# Patient Record
Sex: Male | Born: 1935 | Race: White | Hispanic: No | Marital: Married | Smoking: Never smoker
Health system: Southern US, Community
[De-identification: ages and names within clinical notes are randomized; demographics above are authoritative.]

## PROBLEM LIST (undated history)

## (undated) DIAGNOSIS — M549 Dorsalgia, unspecified: Secondary | ICD-10-CM

## (undated) DIAGNOSIS — G8929 Other chronic pain: Secondary | ICD-10-CM

## (undated) DIAGNOSIS — M109 Gout, unspecified: Secondary | ICD-10-CM

## (undated) DIAGNOSIS — C61 Malignant neoplasm of prostate: Secondary | ICD-10-CM

## (undated) HISTORY — PX: PROSTATECTOMY: SHX69

---

## 1999-09-17 ENCOUNTER — Ambulatory Visit (HOSPITAL_COMMUNITY): Admission: RE | Admit: 1999-09-17 | Discharge: 1999-09-18 | Payer: Self-pay | Admitting: Ophthalmology

## 1999-09-17 ENCOUNTER — Encounter: Payer: Self-pay | Admitting: Ophthalmology

## 2015-03-26 ENCOUNTER — Emergency Department (HOSPITAL_BASED_OUTPATIENT_CLINIC_OR_DEPARTMENT_OTHER)
Admission: EM | Admit: 2015-03-26 | Discharge: 2015-03-26 | Disposition: A | Discharge status: Home / Self Care | Payer: Medicare Other | Attending: Emergency Medicine | Admitting: Emergency Medicine

## 2015-03-26 ENCOUNTER — Emergency Department (HOSPITAL_BASED_OUTPATIENT_CLINIC_OR_DEPARTMENT_OTHER): Payer: Medicare Other

## 2015-03-26 ENCOUNTER — Encounter (HOSPITAL_BASED_OUTPATIENT_CLINIC_OR_DEPARTMENT_OTHER): Payer: Self-pay | Admitting: *Deleted

## 2015-03-26 DIAGNOSIS — M1990 Unspecified osteoarthritis, unspecified site: Secondary | ICD-10-CM | POA: Insufficient documentation

## 2015-03-26 DIAGNOSIS — M25532 Pain in left wrist: Secondary | ICD-10-CM | POA: Diagnosis present

## 2015-03-26 DIAGNOSIS — Z8546 Personal history of malignant neoplasm of prostate: Secondary | ICD-10-CM | POA: Insufficient documentation

## 2015-03-26 DIAGNOSIS — G8929 Other chronic pain: Secondary | ICD-10-CM | POA: Diagnosis not present

## 2015-03-26 HISTORY — DX: Other chronic pain: G89.29

## 2015-03-26 HISTORY — DX: Dorsalgia, unspecified: M54.9

## 2015-03-26 HISTORY — DX: Malignant neoplasm of prostate: C61

## 2015-03-26 MED ORDER — IBUPROFEN 600 MG PO TABS: 600.0000 mg | ORAL_TABLET | Freq: Three times a day (TID) | ORAL | Status: AC | PRN

## 2015-03-26 MED ORDER — OXYCODONE-ACETAMINOPHEN 5-325 MG PO TABS
1.0000 | ORAL_TABLET | Freq: Once | ORAL | Status: AC
  Administered 2015-03-26: 1 via ORAL
  Filled 2015-03-26: qty 1

## 2015-03-26 MED ORDER — IBUPROFEN 200 MG PO TABS
600.0000 mg | ORAL_TABLET | Freq: Once | ORAL | Status: AC
  Administered 2015-03-26: 600 mg via ORAL
  Filled 2015-03-26: qty 1

## 2015-03-26 MED ORDER — DOXYCYCLINE HYCLATE 100 MG PO TABS
100.0000 mg | ORAL_TABLET | Freq: Once | ORAL | Status: AC
  Administered 2015-03-26: 100 mg via ORAL
  Filled 2015-03-26: qty 1

## 2015-03-26 MED ORDER — DOXYCYCLINE HYCLATE 100 MG PO CAPS: 100.0000 mg | ORAL_CAPSULE | Freq: Two times a day (BID) | ORAL | Status: DC

## 2015-03-26 MED ORDER — OXYCODONE-ACETAMINOPHEN 5-325 MG PO TABS: 1.0000 | ORAL_TABLET | ORAL | Status: DC | PRN

## 2015-03-26 MED FILL — OXYCODONE/APAP 5-325: 5-325 | 2 days supply | Qty: 15 | Fill #0

## 2015-03-26 MED FILL — IBUPROFEN 600 MG TABLET: 600 | 5 days supply | Qty: 15 | Fill #0

## 2015-03-26 MED FILL — DOXYCYCLINE HYC 100 MG CAP: 100 | 7 days supply | Qty: 14 | Fill #0

## 2015-03-26 NOTE — Discharge Instructions (Signed)

## 2015-03-26 NOTE — ED Notes (Signed)
Pt sts he was trying to open a pill box 3 weeks ago and began having pain in his left wrist radiating up his left arm. Pt has an appt with Dr. Aretta Nip for mid-March.

## 2015-03-26 NOTE — ED Provider Notes (Signed)
CSN: [id number]     Arrival date & time 03/26/15  0935 History   First MD Initiated Contact with Patient 03/26/15 1019     Chief Complaint  Patient presents with  . Wrist Pain      HPI Patient presents to the emergency department with worsening left thumb pain over the past week.  He is given a wrist splint and has tramadol but this has not improved his symptoms.  He denies fevers and chills.  He reports a history of gout.  He denies injury or trauma to his thumb.  He reports some pain with flexion of the IP joint of the left thumb as well as the pain which initiated at the left MCP joint of the thumb.  No significant spreading redness.  He does feel like he has warmth and redness to his left hand.   Past Medical History  Diagnosis Date  . Chronic back pain   . Prostate cancer [facility])    Past Surgical History  Procedure Laterality Date  . Prostatectomy     No family history on file. Social History  Substance Use Topics  . Smoking status: Never Smoker   . Smokeless tobacco: None  . Alcohol Use: No    Review of Systems  All other systems reviewed and are negative.     Allergies  Review of patient's allergies indicates no known allergies.  Home Medications   Prior to Admission medications   Medication Sig Start Date End Date Taking? Authorizing Provider  doxycycline (VIBRAMYCIN) 100 MG capsule Take 1 capsule (100 mg total) by mouth 2 (two) times daily. 03/26/15   Jola Schmidt, MD  ibuprofen (ADVIL,MOTRIN) 600 MG tablet Take 1 tablet (600 mg total) by mouth every 8 (eight) hours as needed. 03/26/15   Jola Schmidt, MD  oxyCODONE-acetaminophen (PERCOCET/ROXICET) 5-325 MG tablet Take 1 tablet by mouth every 4 (four) hours as needed for severe pain. 03/26/15   Jola Schmidt, MD   BP 106/92 mmHg  Pulse 49  Temp(Src) 99.1 F (37.3 C) (Oral)  Resp 20  Ht 5\' 6"  (1.676 m)  Wt 130 lb (58.968 kg)  BMI 20.99 kg/m2  SpO2 98% Physical Exam  Constitutional: He is oriented to person,  place, and time. He appears well-developed and well-nourished.  HENT:  Head: Normocephalic.  Eyes: EOM are normal.  Neck: Normal range of motion.  Pulmonary/Chest: Effort normal.  Abdominal: He exhibits no distension.  Musculoskeletal: Normal range of motion.  Warmth of the left thumb and pain with range of motion of the left thumb IP joint as well as left thumb MCP joint.  Normal flexion and extension at the left wrist.  Normal left radial pulse.  Normal perfusion of the left thumb.  No extension of erythema or warmth past his wrist.  He does have mild warmth and erythema of his left thumb and dorsum of his left hand.  No obvious drainage or fluctuance.  Neurological: He is alert and oriented to person, place, and time.  Psychiatric: He has a normal mood and affect.  Nursing note and vitals reviewed.   ED Course  Procedures (including critical care time) Labs Review Labs Reviewed - No data to display  Imaging Review Dg Wrist Complete Left  03/26/2015  CLINICAL DATA:  Pt having lateral pain for 3 weeks after opening a pill bottle EXAM: LEFT WRIST - COMPLETE 3+ VIEW COMPARISON:  None. FINDINGS: No distal radius or ulnar fracture. Radiocarpal joint is intact. No carpal fracture. No soft tissue  abnormality. There is calcification in the triangular fiber ligament. IMPRESSION: 1. No evidence acute fracture. 2. 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 Electronically Signed   By: Suzy Bouchard M.D.   On: 03/26/2015 10:39   I have personally reviewed and evaluated these images and lab results as part of my medical decision-making.   EKG Interpretation None      MDM   Final diagnoses:  Inflammatory arthritis ([place])    I suspect this is all inflammatory arthritis/gout.  Patient be started on pain medication and anti-inflammatories.  He will be given a short course of doxycycline as he does have some erythema  and warmth.  There is no obvious fluctuance or anything that needs immediate surgical intervention at this time.  He'll be placed in a thumb spica splint for comfort.  Lastly return to the ER in 72 hours for recheck or sooner for any new or worsening symptoms.  He does have an orthopedist as well.  Vascular follow-up with his orthopedist.  He states he has appointment scheduled for March 10 and thus I have asked that he come back to the ER in 72 hours for recheck    Jola Schmidt, MD 03/26/15 1133

## 2015-03-29 ENCOUNTER — Encounter (HOSPITAL_BASED_OUTPATIENT_CLINIC_OR_DEPARTMENT_OTHER): Payer: Self-pay | Admitting: Emergency Medicine

## 2015-03-29 ENCOUNTER — Emergency Department (HOSPITAL_BASED_OUTPATIENT_CLINIC_OR_DEPARTMENT_OTHER)
Admission: EM | Admit: 2015-03-29 | Discharge: 2015-03-29 | Disposition: A | Discharge status: Home / Self Care | Payer: Medicare Other | Attending: Emergency Medicine | Admitting: Emergency Medicine

## 2015-03-29 DIAGNOSIS — Z8546 Personal history of malignant neoplasm of prostate: Secondary | ICD-10-CM | POA: Insufficient documentation

## 2015-03-29 DIAGNOSIS — G8929 Other chronic pain: Secondary | ICD-10-CM | POA: Insufficient documentation

## 2015-03-29 DIAGNOSIS — Z09 Encounter for follow-up examination after completed treatment for conditions other than malignant neoplasm: Secondary | ICD-10-CM | POA: Insufficient documentation

## 2015-03-29 DIAGNOSIS — Z792 Long term (current) use of antibiotics: Secondary | ICD-10-CM | POA: Insufficient documentation

## 2015-03-29 DIAGNOSIS — M109 Gout, unspecified: Secondary | ICD-10-CM | POA: Diagnosis not present

## 2015-03-29 HISTORY — DX: Gout, unspecified: M10.9

## 2015-03-29 NOTE — Discharge Instructions (Signed)

## 2015-03-29 NOTE — ED Provider Notes (Signed)
CSN: [id number]     Arrival date & time 03/29/15  [id number] History   First MD Initiated Contact with Patient 03/29/15 0900     Chief Complaint  Patient presents with  . Follow-up     (Consider location/radiation/quality/duration/timing/severity/associated sxs/prior Treatment) HPI   Frederick Allen is a 82 y.o. male with PMH significant for chronic back pain, gout, and prostate cancer who presents with follow up from 03/26/15 visit for inflammatory arthritis/gout of his left thumb. At the time, he had decreased ROM, pain with movement, swelling, and erythema.  He states his symptoms have completely resolved.  Patient was discharged home with percocet, ibuprofen, and doxycycline.  He has been compliant with all medications. Denies fever, chills, nausea, vomiting, left thumb pain, or any other symptoms.   Past Medical History  Diagnosis Date  . Chronic back pain   . Prostate cancer ([place])   . Gout attack    Past Surgical History  Procedure Laterality Date  . Prostatectomy     No family history on file. Social History  Substance Use Topics  . Smoking status: Never Smoker   . Smokeless tobacco: None  . Alcohol Use: No    Review of Systems All other systems negative unless otherwise stated in HPI    Allergies  Review of patient's allergies indicates no known allergies.  Home Medications   Prior to Admission medications   Medication Sig Start Date End Date Taking? Authorizing Provider  doxycycline (VIBRAMYCIN) 100 MG capsule Take 1 capsule (100 mg total) by mouth 2 (two) times daily. 03/26/15   Jola Schmidt, MD  ibuprofen (ADVIL,MOTRIN) 600 MG tablet Take 1 tablet (600 mg total) by mouth every 8 (eight) hours as needed. 03/26/15   Jola Schmidt, MD  oxyCODONE-acetaminophen (PERCOCET/ROXICET) 5-325 MG tablet Take 1 tablet by mouth every 4 (four) hours as needed for severe pain. 03/26/15   Jola Schmidt, MD   BP 158/112 mmHg  Pulse 72  Temp(Src) 98.2 F (36.8 C) (Oral)  Resp 20   SpO2 96% Physical Exam  Constitutional: He is oriented to person, place, and time. He appears well-developed and well-nourished.  HENT:  Head: Normocephalic and atraumatic.  Right Ear: External ear normal.  Left Ear: External ear normal.  Eyes: Conjunctivae are normal. No scleral icterus.  Neck: No tracheal deviation present.  Cardiovascular:  Pulses:      Radial pulses are 2+ on the right side, and 2+ on the left side.  Pulmonary/Chest: Effort normal. No respiratory distress.  Abdominal: He exhibits no distension.  Musculoskeletal: Normal range of motion.  No warmth, erythema, induration, fluctuance, or swelling of the left thumb. No pain with full active range of motion of IPJ and MCPJ of left thumb. Full active range of motion of wrist flexion and extension without pain.  Neurological: He is alert and oriented to person, place, and time.  Skin: Skin is warm and dry. No erythema.  Psychiatric: He has a normal mood and affect. His behavior is normal.    ED Course  Procedures (including critical care time) Labs Review Labs Reviewed - No data to display  Imaging Review No results found. I have personally reviewed and evaluated these images and lab results as part of my medical decision-making.   EKG Interpretation None      MDM   Final diagnoses:  Follow up    Patient presents for recheck of his left thumb inflammatory arthritis/gout. She has been compliant with his discharge medications which include doxycycline, Percocet, and  ibuprofen. Denies any systemic symptoms. Patient advised to complete his course of doxycycline. On exam, no signs of infection with full active range of motion of left thumb at the IP joint and MCP joint without pain. NVI. Follow-up PCP. Discussed return precautions. Patient agrees and acknowledges the above plan for discharge.    Gloriann Loan, PA-C 03/29/15 0935  Forde Dandy, MD 03/29/15 [phone]

## 2015-03-29 NOTE — ED Notes (Signed)
Pt here for recheck of his left thumb.  Pt states it is much better.  Swelling has decreased, pain has decreased, movement has improved.

## 2016-10-04 ENCOUNTER — Encounter (HOSPITAL_COMMUNITY): Payer: Self-pay | Admitting: Emergency Medicine

## 2016-10-04 ENCOUNTER — Inpatient Hospital Stay (HOSPITAL_COMMUNITY)
Admission: EM | Admit: 2016-10-04 | Discharge: 2016-10-09 | DRG: 694 | Disposition: A | Discharge status: Skilled Nursing Facility | Payer: Medicare Other | Attending: Urology | Admitting: Urology

## 2016-10-04 ENCOUNTER — Emergency Department (HOSPITAL_COMMUNITY): Payer: Medicare Other

## 2016-10-04 DIAGNOSIS — R05 Cough: Secondary | ICD-10-CM

## 2016-10-04 DIAGNOSIS — N132 Hydronephrosis with renal and ureteral calculous obstruction: Principal | ICD-10-CM | POA: Diagnosis present

## 2016-10-04 DIAGNOSIS — Z9079 Acquired absence of other genital organ(s): Secondary | ICD-10-CM

## 2016-10-04 DIAGNOSIS — F0390 Unspecified dementia without behavioral disturbance: Secondary | ICD-10-CM | POA: Diagnosis present

## 2016-10-04 DIAGNOSIS — G8929 Other chronic pain: Secondary | ICD-10-CM | POA: Diagnosis present

## 2016-10-04 DIAGNOSIS — R059 Cough, unspecified: Secondary | ICD-10-CM

## 2016-10-04 DIAGNOSIS — N200 Calculus of kidney: Secondary | ICD-10-CM | POA: Diagnosis not present

## 2016-10-04 DIAGNOSIS — N201 Calculus of ureter: Secondary | ICD-10-CM | POA: Diagnosis present

## 2016-10-04 DIAGNOSIS — Z888 Allergy status to other drugs, medicaments and biological substances status: Secondary | ICD-10-CM

## 2016-10-04 DIAGNOSIS — Z8546 Personal history of malignant neoplasm of prostate: Secondary | ICD-10-CM

## 2016-10-04 DIAGNOSIS — L8990 Pressure ulcer of unspecified site, unspecified stage: Secondary | ICD-10-CM | POA: Diagnosis present

## 2016-10-04 DIAGNOSIS — R509 Fever, unspecified: Secondary | ICD-10-CM

## 2016-10-04 MED ORDER — ACETAMINOPHEN 500 MG PO TABS: 1000.0000 mg | ORAL_TABLET | Freq: Once | ORAL | Status: DC

## 2016-10-04 MED ORDER — SODIUM CHLORIDE 0.9 % IV BOLUS (SEPSIS)
1000.0000 mL | Freq: Once | INTRAVENOUS | Status: AC
  Administered 2016-10-05: 1000 mL via INTRAVENOUS

## 2016-10-04 NOTE — ED Triage Notes (Signed)
Brought in by EMS from [facility] facility with c/o fever, onset 5 hours ago.  Pt's oral temp by EMS was T101.8---- was given Tylenol 1000 mg po.  Per staff, pt was observed to be weak and lethargic.

## 2016-10-04 NOTE — ED Notes (Signed)
Bed: MI19 Expected date:  Expected time:  Means of arrival:  Comments: 66m fever

## 2016-10-05 ENCOUNTER — Emergency Department (HOSPITAL_COMMUNITY): Payer: Medicare Other

## 2016-10-05 ENCOUNTER — Encounter (HOSPITAL_COMMUNITY)
Admission: EM | Disposition: A | Discharge status: Skilled Nursing Facility | Payer: Self-pay | Source: Home / Self Care | Attending: Urology

## 2016-10-05 ENCOUNTER — Encounter (HOSPITAL_COMMUNITY): Payer: Self-pay | Admitting: *Deleted

## 2016-10-05 ENCOUNTER — Emergency Department (HOSPITAL_COMMUNITY): Payer: Medicare Other | Admitting: Anesthesiology

## 2016-10-05 DIAGNOSIS — L8990 Pressure ulcer of unspecified site, unspecified stage: Secondary | ICD-10-CM | POA: Insufficient documentation

## 2016-10-05 DIAGNOSIS — N201 Calculus of ureter: Secondary | ICD-10-CM | POA: Diagnosis present

## 2016-10-05 HISTORY — PX: CYSTOSCOPY W/ URETERAL STENT PLACEMENT: SHX1429

## 2016-10-05 LAB — URINALYSIS, ROUTINE W REFLEX MICROSCOPIC
Bilirubin Urine: NEGATIVE
Glucose, UA: NEGATIVE mg/dL
KETONES UR: 5 mg/dL — AB
Nitrite: NEGATIVE
PROTEIN: 30 mg/dL — AB
Specific Gravity, Urine: 1.017 (ref 1.005–1.030)
pH: 5 (ref 5.0–8.0)

## 2016-10-05 LAB — COMPREHENSIVE METABOLIC PANEL
ALT: 12 U/L — AB (ref 17–63)
AST: 35 U/L (ref 15–41)
Albumin: 3.7 g/dL (ref 3.5–5.0)
Alkaline Phosphatase: 42 U/L (ref 38–126)
Anion gap: 10 (ref 5–15)
BILIRUBIN TOTAL: 0.7 mg/dL (ref 0.3–1.2)
BUN: 62 mg/dL — AB (ref 6–20)
CALCIUM: 8.9 mg/dL (ref 8.9–10.3)
CO2: 18 mmol/L — ABNORMAL LOW (ref 22–32)
CREATININE: 2.47 mg/dL — AB (ref 0.61–1.24)
Chloride: 111 mmol/L (ref 101–111)
GFR calc Af Amer: 24 mL/min — ABNORMAL LOW (ref >60)
GFR, EST NON AFRICAN AMERICAN: 21 mL/min — AB (ref >60)
GLUCOSE: 180 mg/dL — AB (ref 65–99)
Potassium: 3.7 mmol/L (ref 3.5–5.1)
Sodium: 139 mmol/L (ref 135–145)
Total Protein: 7.1 g/dL (ref 6.5–8.1)

## 2016-10-05 LAB — C DIFFICILE QUICK SCREEN W PCR REFLEX
C Diff antigen: NEGATIVE
C Diff interpretation: NOT DETECTED
C Diff toxin: NEGATIVE

## 2016-10-05 LAB — CBC WITH DIFFERENTIAL/PLATELET
BASOS PCT: 0 %
Basophils Absolute: 0 10*3/uL (ref 0.0–0.1)
Eosinophils Absolute: 0 10*3/uL (ref 0.0–0.7)
Eosinophils Relative: 0 %
HEMATOCRIT: 30 % — AB (ref 39.0–52.0)
Hemoglobin: 10 g/dL — ABNORMAL LOW (ref 13.0–17.0)
Lymphocytes Relative: 14 %
Lymphs Abs: 1.4 10*3/uL (ref 0.7–4.0)
MCH: 29.5 pg (ref 26.0–34.0)
MCHC: 33.3 g/dL (ref 30.0–36.0)
MCV: 88.5 fL (ref 78.0–100.0)
MONO ABS: 1.8 10*3/uL — AB (ref 0.1–1.0)
MONOS PCT: 17 %
NEUTROS ABS: 7.3 10*3/uL (ref 1.7–7.7)
Neutrophils Relative %: 69 %
Platelets: 230 10*3/uL (ref 150–400)
RBC: 3.39 MIL/uL — ABNORMAL LOW (ref 4.22–5.81)
RDW: 15 % (ref 11.5–15.5)
WBC: 10.5 10*3/uL (ref 4.0–10.5)

## 2016-10-05 LAB — MRSA PCR SCREENING: MRSA BY PCR: NEGATIVE

## 2016-10-05 LAB — I-STAT CG4 LACTIC ACID, ED: Lactic Acid, Venous: 1.45 mmol/L (ref 0.5–1.9)

## 2016-10-05 SURGERY — CYSTOSCOPY, WITH RETROGRADE PYELOGRAM AND URETERAL STENT INSERTION
Anesthesia: Monitor Anesthesia Care | Site: Ureter | Laterality: Left

## 2016-10-05 MED ORDER — SODIUM CHLORIDE 0.9 % IV SOLN
Freq: Once | INTRAVENOUS | Status: AC
  Administered 2016-10-05: 05:00:00 via INTRAVENOUS

## 2016-10-05 MED ORDER — PIPERACILLIN-TAZOBACTAM 3.375 G IVPB 30 MIN: 3.3750 g | Freq: Once | INTRAVENOUS | Status: DC

## 2016-10-05 MED ORDER — SODIUM CHLORIDE 0.9 % IV SOLN
INTRAVENOUS | Status: DC | PRN
  Administered 2016-10-05: 09:00:00 via INTRAVENOUS

## 2016-10-05 MED ORDER — IOHEXOL 300 MG/ML  SOLN
INTRAMUSCULAR | Status: DC | PRN
  Administered 2016-10-05: 10 mL

## 2016-10-05 MED ORDER — PIPERACILLIN-TAZOBACTAM IN DEX 2-0.25 GM/50ML IV SOLN
2.2500 g | Freq: Once | INTRAVENOUS | Status: AC
  Administered 2016-10-05: 2.25 g via INTRAVENOUS
  Filled 2016-10-05 (×2): qty 50

## 2016-10-05 MED ORDER — 0.9 % SODIUM CHLORIDE (POUR BTL) OPTIME
TOPICAL | Status: DC | PRN
  Administered 2016-10-05: 1000 mL

## 2016-10-05 MED ORDER — SODIUM CHLORIDE 0.9 % IR SOLN
Status: DC | PRN
  Administered 2016-10-05: 3000 mL

## 2016-10-05 MED ORDER — SENNA 8.6 MG PO TABS: 1.0000 | ORAL_TABLET | Freq: Two times a day (BID) | ORAL | Status: DC

## 2016-10-05 MED ORDER — FENTANYL CITRATE (PF) 100 MCG/2ML IJ SOLN
INTRAMUSCULAR | Status: AC
  Filled 2016-10-05: qty 2

## 2016-10-05 MED ORDER — PROMETHAZINE HCL 25 MG/ML IJ SOLN: 6.2500 mg | INTRAMUSCULAR | Status: DC | PRN

## 2016-10-05 MED ORDER — TRAMADOL HCL 50 MG PO TABS
50.0000 mg | ORAL_TABLET | Freq: Two times a day (BID) | ORAL | Status: DC | PRN
  Administered 2016-10-08 – 2016-10-09 (×2): 50 mg via ORAL
  Filled 2016-10-05 (×2): qty 1

## 2016-10-05 MED ORDER — ACETAMINOPHEN 325 MG PO TABS
650.0000 mg | ORAL_TABLET | ORAL | Status: DC | PRN
  Administered 2016-10-07 – 2016-10-08 (×2): 650 mg via ORAL
  Filled 2016-10-05 (×2): qty 2

## 2016-10-05 MED ORDER — SODIUM CHLORIDE 0.9 % IV BOLUS (SEPSIS)
1000.0000 mL | Freq: Once | INTRAVENOUS | Status: AC
  Administered 2016-10-05: 1000 mL via INTRAVENOUS

## 2016-10-05 MED ORDER — VANCOMYCIN HCL IN DEXTROSE 1-5 GM/200ML-% IV SOLN
1000.0000 mg | Freq: Once | INTRAVENOUS | Status: AC
  Administered 2016-10-05: 1000 mg via INTRAVENOUS
  Filled 2016-10-05: qty 200

## 2016-10-05 MED ORDER — SODIUM CHLORIDE 0.45 % IV SOLN
INTRAVENOUS | Status: DC
  Administered 2016-10-05 – 2016-10-08 (×4): via INTRAVENOUS

## 2016-10-05 MED ORDER — PIPERACILLIN-TAZOBACTAM 3.375 G IVPB 30 MIN
3.3750 g | Freq: Once | INTRAVENOUS | Status: AC
  Administered 2016-10-05: 3.375 g via INTRAVENOUS
  Filled 2016-10-05: qty 50

## 2016-10-05 MED ORDER — FENTANYL CITRATE (PF) 100 MCG/2ML IJ SOLN
INTRAMUSCULAR | Status: DC | PRN
  Administered 2016-10-05 (×3): 25 ug via INTRAVENOUS

## 2016-10-05 MED ORDER — DEXTROSE 5 % IV SOLN
1.0000 g | Freq: Once | INTRAVENOUS | Status: AC
  Administered 2016-10-05: 1 g via INTRAVENOUS
  Filled 2016-10-05: qty 10

## 2016-10-05 MED ORDER — ENOXAPARIN SODIUM 30 MG/0.3ML ~~LOC~~ SOLN
30.0000 mg | SUBCUTANEOUS | Status: DC
  Administered 2016-10-05 – 2016-10-08 (×4): 30 mg via SUBCUTANEOUS
  Filled 2016-10-05 (×4): qty 0.3

## 2016-10-05 MED ORDER — PROPOFOL 10 MG/ML IV BOLUS
INTRAVENOUS | Status: AC
  Filled 2016-10-05: qty 20

## 2016-10-05 MED ORDER — OXYBUTYNIN CHLORIDE 5 MG PO TABS: 5.0000 mg | ORAL_TABLET | Freq: Three times a day (TID) | ORAL | Status: DC | PRN

## 2016-10-05 MED ORDER — FENTANYL CITRATE (PF) 100 MCG/2ML IJ SOLN: 25.0000 ug | INTRAMUSCULAR | Status: DC | PRN

## 2016-10-05 MED ORDER — PROPOFOL 500 MG/50ML IV EMUL
INTRAVENOUS | Status: DC | PRN
  Administered 2016-10-05: 25 ug/kg/min via INTRAVENOUS

## 2016-10-05 MED ORDER — ONDANSETRON HCL 4 MG/2ML IJ SOLN
INTRAMUSCULAR | Status: AC
  Filled 2016-10-05: qty 2

## 2016-10-05 SURGICAL SUPPLY — 16 items
BAG URO CATCHER STRL LF (MISCELLANEOUS) ×3 IMPLANT
CATH INTERMIT  6FR 70CM (CATHETERS) IMPLANT
CLOTH BEACON ORANGE TIMEOUT ST (SAFETY) ×3 IMPLANT
COVER FOOTSWITCH UNIV (MISCELLANEOUS) IMPLANT
COVER SURGICAL LIGHT HANDLE (MISCELLANEOUS) ×3 IMPLANT
GLOVE BIOGEL M 8.0 STRL (GLOVE) ×3 IMPLANT
GOWN STRL REUS W/ TWL XL LVL3 (GOWN DISPOSABLE) ×1 IMPLANT
GOWN STRL REUS W/TWL LRG LVL3 (GOWN DISPOSABLE) ×3 IMPLANT
GOWN STRL REUS W/TWL XL LVL3 (GOWN DISPOSABLE) ×2
GUIDEWIRE ANG ZIPWIRE 038X150 (WIRE) IMPLANT
GUIDEWIRE STR DUAL SENSOR (WIRE) ×3 IMPLANT
MANIFOLD NEPTUNE II (INSTRUMENTS) ×3 IMPLANT
PACK CYSTO (CUSTOM PROCEDURE TRAY) ×3 IMPLANT
STENT CONTOUR 6FRX28X.038 (STENTS) ×3 IMPLANT
TUBING CONNECTING 10 (TUBING) ×2 IMPLANT
TUBING CONNECTING 10' (TUBING) ×1

## 2016-10-05 NOTE — Progress Notes (Signed)
Peri-Operative Nsg Note: Patient arrived into holding area, for surgery, pt identified by name, armband and DOB. Initial vs obtained, IV access x 2 identified and assessed. Safety measures in place, warm blankets and comfort measures provided

## 2016-10-05 NOTE — Op Note (Signed)
Preoperative diagnosis:left ureteral stone, 7 millimeters with possible UTI  Postoperative diagnosis:same   Procedure:cystoscopy, left retrograde ureteropyelogram with fluoroscopic interpretation, placement of 6 French by 26 centimeter contour double-J stent without tether    Surgeon: Lillette Boxer. Fuad Forget, M.D.   Anesthesia: Gen.   Complications:none  Specimen(s):urine from left upper tract, for culture  Drain(s):6 [place] by 26 centimeter contour double-J sten  Indications:83 year old male presenting to the emergency room late last night with fever to 101.8 and abdominal pain.  The patient had CT scan which revealed a 7 millimeter left distal ureteral stone with mild to moderate left hydroureteronephrosis. With his fever, despite no strong evidence of a urinary tract infection, it was recommended that he be admitted and undergo left double-J stent placement to decompress his left renal unit with antibiotic management.  I discussed the case with the patient, as well as the urgent need to place the stent.  He understands the procedure as well as low risk of complications, I.e., bladder/ureteral trauma.  He desires to proceed.    Technique and findings:the patient was properly identified, marked in the recovery room, received preoperative IV antibiotics prior to his presentation to the holding area.  He was taken the operating room where IV sedation was administered.  He was then placed in the dorsolithotomy position.  Genitalia and perineum were prepped and draped.  Proper timeout was performed.  A 21 French panendoscope was advanced into his bladder.  Urethra was normal, without stricture/lesions.  Prostate was not obstructive.  The bladder was then entered.  Circumferential inspection was performed, with no urothelial abnormalities noted.  The ureteral orifices were normal in their location and configuration.  A 6 [place] open-ended cater was advanced into the left ureteral orifice.  It was  passed proximally, where a hydronephrotic drip was obtained.  Urine was collected at this point and sent for culture.  The open-ended catheter was then backed downto the ureterovesical junction.  Omnipaque was utilized to perform a retrograde ureteropyelogram.  This revealed a filling defect in the distal ureter, approximately 2 centimeters up.  There was proximal hydroureter, with a significant tortuous path of the ureter seen.  Pyelocalyceal system was somewhat dilated, but not totally filled because of its capacious nature.  No further filling defects were seen.    At this point, the 0.038 inch sensor tip guidewire was advanced through the open-ended catheter, through the enre ureter where curl was seen in the upper pole calyx.  I then removed the open-ended catheter, and then placed a 26 centimeter by 6 French contour double-J stent.  The tether was removed.  This was adequately positioned with good proximal and distal curl seen, once the guidewire was removed. The bladder was then drained.  The scope was removed.  The patient completed his sedation, was awakened, and taken to the PACU in stable condition.  He tolerated the procedure well.

## 2016-10-05 NOTE — Anesthesia Preprocedure Evaluation (Signed)
Anesthesia Evaluation  Patient identified by MRN, date of birth, ID band Patient awake    Reviewed: Allergy & Precautions, NPO status , Patient's Chart, lab work & pertinent test results  Airway Mallampati: II  TM Distance: >3 FB Neck ROM: Limited    Dental no notable dental hx.    Pulmonary neg pulmonary ROS,    Pulmonary exam normal breath sounds clear to auscultation       Cardiovascular negative cardio ROS Normal cardiovascular exam Rhythm:Regular Rate:Normal     Neuro/Psych negative neurological ROS  negative psych ROS   GI/Hepatic negative GI ROS, Neg liver ROS,   Endo/Other  negative endocrine ROS  Renal/GU ARF and Renal InsufficiencyRenal disease  negative genitourinary   Musculoskeletal negative musculoskeletal ROS (+)   Abdominal   Peds negative pediatric ROS (+)  Hematology  (+) anemia ,   Anesthesia Other Findings   Reproductive/Obstetrics negative OB ROS                             Anesthesia Physical Anesthesia Plan  ASA: III and emergent  Anesthesia Plan: MAC   Post-op Pain Management:    Induction: Intravenous  PONV Risk Score and Plan: 0  Airway Management Planned: Simple Face Mask  Additional Equipment:   Intra-op Plan:   Post-operative Plan:   Informed Consent: I have reviewed the patients History and Physical, chart, labs and discussed the procedure including the risks, benefits and alternatives for the proposed anesthesia with the patient or authorized representative who has indicated his/her understanding and acceptance.   Dental advisory given  Plan Discussed with: CRNA and Surgeon  Anesthesia Plan Comments:         Anesthesia Quick Evaluation

## 2016-10-05 NOTE — ED Provider Notes (Signed)
[place] DEPT [facility] Provider Note   CSN: [id number] Arrival date & time: 10/04/16  2333     History   Chief Complaint Chief Complaint  Patient presents with  . Fever    HPI Frederick Allen is a 83 y.o. male.  83 yo M with a chief complaint of a fever. This was noted that his nursing home. He denies cough congestion denies abdominal pain vomiting diarrhea denies dysuria. He actually is not sure that he has had a fever. Level V caveat dementia.   The history is provided by the patient.  Fever   This is a new problem. The current episode started yesterday. The problem occurs constantly. The problem has not changed since onset.The maximum temperature noted was 101 to 101.9 F. The temperature was taken using an oral thermometer. Pertinent negatives include no chest pain, no diarrhea, no vomiting, no congestion and no headaches. He has tried acetaminophen for the symptoms. The treatment provided mild relief.    Past Medical History:  Diagnosis Date  . Chronic back pain   . Gout attack   . Prostate cancer [facility])     Patient Active Problem List   Diagnosis Date Noted  . Calculus of ureter 10/05/2016  . Pressure injury of skin 10/05/2016    Past Surgical History:  Procedure Laterality Date  . CYSTOSCOPY W/ URETERAL STENT PLACEMENT Left 10/05/2016   Procedure: CYSTOSCOPY WITH LEFT RETROGRADE PYELOGRAM/ LEFT URETERAL STENT PLACEMENT;  Surgeon: Franchot Gallo, MD;  Location: [facility];  Service: Urology;  Laterality: Left;  . PROSTATECTOMY         Home Medications    Prior to Admission medications   Medication Sig Start Date End Date Taking? Authorizing Provider  ferrous sulfate 325 (65 FE) MG tablet Take 325 mg by mouth 3 (three) times daily with meals.   Yes [provider]  loperamide (IMODIUM A-D) 2 MG tablet Take 2 mg by mouth 4 (four) times daily as needed for diarrhea or loose stools.   Yes [provider]  doxycycline (VIBRAMYCIN) 100 MG capsule  Take 1 capsule (100 mg total) by mouth 2 (two) times daily. Patient not taking: Reported on 10/05/2016 03/26/15   Jola Schmidt, MD  ibuprofen (ADVIL,MOTRIN) 600 MG tablet Take 1 tablet (600 mg total) by mouth every 8 (eight) hours as needed. Patient not taking: Reported on 10/05/2016 03/26/15   Jola Schmidt, MD  oxyCODONE-acetaminophen (PERCOCET/ROXICET) 5-325 MG tablet Take 1 tablet by mouth every 4 (four) hours as needed for severe pain. Patient not taking: Reported on 10/05/2016 03/26/15   Jola Schmidt, MD  traMADol (ULTRAM) 50 MG tablet Take 50 mg by mouth every 8 (eight) hours as needed for moderate pain.     [provider]    Family History History reviewed. No pertinent family history.  Social History Social History  Substance Use Topics  . Smoking status: Never Smoker  . Smokeless tobacco: Never Used  . Alcohol use No     Allergies   Levothyroxine; Celecoxib; Valdecoxib; and Blue dyes (parenteral)   Review of Systems Review of Systems  Constitutional: Positive for fever. Negative for chills.  HENT: Negative for congestion and facial swelling.   Eyes: Negative for discharge and visual disturbance.  Respiratory: Negative for shortness of breath.   Cardiovascular: Negative for chest pain and palpitations.  Gastrointestinal: Negative for abdominal pain, diarrhea and vomiting.  Musculoskeletal: Negative for arthralgias and myalgias.  Skin: Negative for color change and rash.  Neurological: Negative for tremors,  syncope and headaches.  Psychiatric/Behavioral: Negative for confusion and dysphoric mood.     Physical Exam Updated Vital Signs BP (!) 150/70 (BP Location: Left Arm)   Pulse (!) 55   Temp 99.5 F (37.5 C) (Oral)   Resp 18   Ht 5\' 8"  (1.727 m)   Wt 72.6 kg (160 lb)   SpO2 98%   BMI 24.33 kg/m   Physical Exam  Constitutional: He is oriented to person, place, and time.  cachectic  HENT:  Head: Normocephalic and atraumatic.  Eyes: Pupils are equal,  round, and reactive to light. EOM are normal.  Neck: Normal range of motion. Neck supple. No JVD present.  Cardiovascular: Normal rate and regular rhythm.  Exam reveals no gallop and no friction rub.   No murmur heard. Pulmonary/Chest: No respiratory distress. He has no wheezes.  Abdominal: He exhibits no distension and no mass. There is no tenderness. There is no rebound and no guarding.  Musculoskeletal: Normal range of motion.  Neurological: He is alert and oriented to person, place, and time.  Skin: No rash noted. No pallor.  Psychiatric: He has a normal mood and affect. His behavior is normal.  Nursing note and vitals reviewed.    ED Treatments / Results  Labs (all labs ordered are listed, but only abnormal results are displayed) Labs Reviewed  COMPREHENSIVE METABOLIC PANEL - Abnormal; Notable for the following:       Result Value   CO2 18 (*)    Glucose, Bld 180 (*)    BUN 62 (*)    Creatinine, Ser 2.47 (*)    ALT 12 (*)    GFR calc non Af Amer 21 (*)    GFR calc Af Amer 24 (*)    All other components within normal limits  CBC WITH DIFFERENTIAL/PLATELET - Abnormal; Notable for the following:    RBC 3.39 (*)    Hemoglobin 10.0 (*)    HCT 30.0 (*)    Monocytes Absolute 1.8 (*)    All other components within normal limits  URINALYSIS, ROUTINE W REFLEX MICROSCOPIC - Abnormal; Notable for the following:    APPearance HAZY (*)    Hgb urine dipstick MODERATE (*)    Ketones, ur 5 (*)    Protein, ur 30 (*)    Leukocytes, UA SMALL (*)    Bacteria, UA RARE (*)    Squamous Epithelial / LPF 0-5 (*)    All other components within normal limits  CULTURE, BLOOD (ROUTINE X 2)  CULTURE, BLOOD (ROUTINE X 2)  URINE CULTURE  URINE CULTURE  MRSA PCR SCREENING  C DIFFICILE QUICK SCREEN W PCR REFLEX  I-STAT CG4 LACTIC ACID, ED    EKG  EKG Interpretation  Date/Time:  Sunday October 05 2016 11:13:00 EDT Ventricular Rate:  47 PR Interval:    QRS Duration: 104 QT  Interval:  522 QTC Calculation: 461 R Axis:   -10 Text Interpretation:  Atrial fibrillation with slow ventricular response Nonspecific T wave abnormality Prolonged QT Poor data quality Confirmed by Lajean Saver [phone]) on 10/06/2016 11:33:27 AM       Radiology No results found.  Procedures Procedures (including critical care time)  Medications Ordered in ED Medications  traMADol (ULTRAM) tablet 50 mg (not administered)  0.45 % sodium chloride infusion ( Intravenous New Bag/Given 10/07/16 2021)  acetaminophen (TYLENOL) tablet 650 mg (650 mg Oral Given 10/07/16 1542)  oxybutynin (DITROPAN) tablet 5 mg (not administered)  enoxaparin (LOVENOX) injection 30 mg (30 mg Subcutaneous Given  10/07/16 2214)  saccharomyces boulardii (FLORASTOR) capsule 250 mg (250 mg Oral Given 10/07/16 2214)  sodium chloride 0.9 % bolus 1,000 mL (0 mLs Intravenous Stopped 10/05/16 0140)  vancomycin (VANCOCIN) IVPB 1000 mg/200 mL premix (0 mg Intravenous Stopped 10/05/16 0134)  piperacillin-tazobactam (ZOSYN) IVPB 3.375 g (0 g Intravenous Stopped 10/05/16 0103)  sodium chloride 0.9 % bolus 1,000 mL (0 mLs Intravenous Stopped 10/05/16 0103)  sodium chloride 0.9 % bolus 1,000 mL (0 mLs Intravenous Stopped 10/05/16 0231)  sodium chloride 0.9 % bolus 1,000 mL (0 mLs Intravenous Stopped 10/05/16 0550)  0.9 %  sodium chloride infusion ( Intravenous New Bag/Given 10/05/16 0458)  piperacillin-tazobactam (ZOSYN) IVPB 2.25 g ( Intravenous MAR Unhold 10/05/16 1144)  cefTRIAXone (ROCEPHIN) 1 g in dextrose 5 % 50 mL IVPB (0 g Intravenous Stopped 10/05/16 2121)  vitamin A & D ointment (1 application  Given 0/93/23 2315)     Initial Impression / Assessment and Plan / ED Course  I have reviewed the triage vital signs and the nursing notes.  Pertinent labs & imaging results that were available during my care of the patient were reviewed by me and considered in my medical decision making (see chart for details).     83 yo M With a chief  complaint of a fever. Patient was found to have a blood pressure in the 70s on arrival. Code sepsis initiated. Will give broad-spectrum antibiotics.  Blood pressure improved with IV fluids. Labs are concerning for significant dehydration. CT scan of the abdomen pelvis concerning for a 72millimeter UVJ stone. Discussed case with Dr. Diona Fanti, will come and eval patient.   CRITICAL CARE Performed by: Cecilio Asper   Total critical care time: 80 minutes  Critical care time was exclusive of separately billable procedures and treating other patients.  Critical care was necessary to treat or prevent imminent or life-threatening deterioration.  Critical care was time spent personally by me on the following activities: development of treatment plan with patient and/or surrogate as well as nursing, discussions with consultants, evaluation of patient's response to treatment, examination of patient, obtaining history from patient or surrogate, ordering and performing treatments and interventions, ordering and review of laboratory studies, ordering and review of radiographic studies, pulse oximetry and re-evaluation of patient's condition.  The patients results and plan were reviewed and discussed.   Any x-rays performed were independently reviewed by myself.   Differential diagnosis were considered with the presenting HPI.  Medications  traMADol (ULTRAM) tablet 50 mg (not administered)  0.45 % sodium chloride infusion ( Intravenous New Bag/Given 10/07/16 2021)  acetaminophen (TYLENOL) tablet 650 mg (650 mg Oral Given 10/07/16 1542)  oxybutynin (DITROPAN) tablet 5 mg (not administered)  enoxaparin (LOVENOX) injection 30 mg (30 mg Subcutaneous Given 10/07/16 2214)  saccharomyces boulardii (FLORASTOR) capsule 250 mg (250 mg Oral Given 10/07/16 2214)  sodium chloride 0.9 % bolus 1,000 mL (0 mLs Intravenous Stopped 10/05/16 0140)  vancomycin (VANCOCIN) IVPB 1000 mg/200 mL premix (0 mg Intravenous  Stopped 10/05/16 0134)  piperacillin-tazobactam (ZOSYN) IVPB 3.375 g (0 g Intravenous Stopped 10/05/16 0103)  sodium chloride 0.9 % bolus 1,000 mL (0 mLs Intravenous Stopped 10/05/16 0103)  sodium chloride 0.9 % bolus 1,000 mL (0 mLs Intravenous Stopped 10/05/16 0231)  sodium chloride 0.9 % bolus 1,000 mL (0 mLs Intravenous Stopped 10/05/16 0550)  0.9 %  sodium chloride infusion ( Intravenous New Bag/Given 10/05/16 0458)  piperacillin-tazobactam (ZOSYN) IVPB 2.25 g ( Intravenous MAR Unhold 10/05/16 1144)  cefTRIAXone (ROCEPHIN) 1 g in  dextrose 5 % 50 mL IVPB (0 g Intravenous Stopped 10/05/16 2121)  vitamin A & D ointment (1 application  Given 8/92/11 2315)    Vitals:   10/07/16 1538 10/07/16 1757 10/07/16 2121 10/08/16 0426  BP:   (!) 156/68 (!) 150/70  Pulse:   62 (!) 55  Resp:   18 18  Temp: (!) 100.5 F (38.1 C) 98.3 F (36.8 C) 97.9 F (36.6 C) 99.5 F (37.5 C)  TempSrc: Oral  Oral Oral  SpO2:   100% 98%  Weight:      Height:        Final diagnoses:  Nephrolithiasis  Fever in adult    Admission/ observation were discussed with the admitting physician, patient and/or family and they are comfortable with the plan.   Final Clinical Impressions(s) / ED Diagnoses   Final diagnoses:  Nephrolithiasis  Fever in adult    New Prescriptions Current Discharge Medication List       Deno Etienne, DO 10/08/16 9417

## 2016-10-05 NOTE — Transfer of Care (Signed)
Immediate Anesthesia Transfer of Care Note  Patient: Frederick Allen  Procedure(s) Performed: Procedure(s): CYSTOSCOPY WITH LEFT RETROGRADE PYELOGRAM/ LEFT URETERAL STENT PLACEMENT (Left)  Patient Location: PACU  Anesthesia Type:MAC  Level of Consciousness:  sedated, patient cooperative and responds to stimulation  Airway & Oxygen Therapy:Patient Spontanous Breathing and Patient connected to face mask oxgen  Post-op Assessment:  Report given to PACU RN and Post -op Vital signs reviewed and stable  Post vital signs:  Reviewed and stable  Last Vitals:  Vitals:   10/05/16 0809 10/05/16 0855  BP: 134/72 (!) 118/56  Pulse: 60 60  Resp: 18 16  Temp: 36.9 C   SpO2: 11% 55%    Complications: No apparent anesthesia complications

## 2016-10-05 NOTE — ED Notes (Signed)
Per patient's request, Gretta Cool, was called and updated on patient's condition and that he is going to surgery. Marden Noble states that pt has capacity to sign his own paperwork.

## 2016-10-05 NOTE — Progress Notes (Signed)
Guerry Bruin, RN spoke via phone to Dr. Myrtie Soman regarding EKG rhythm which appears to be A-Fib. !2 lead ECG done with A-Fib confirmed. Per Dr. Kalman Shan it is ok to transfer to floor if B/P and other V/S remain stable.

## 2016-10-05 NOTE — Consult Note (Addendum)
Urology H&P  Consulting MD:Dan Tyrone Nine, D.O.  CC: fever, kidney stone  HPI: This is a 83 year old male , fairly healthy and a resident at [facility] assisted living center is sent to the emergency room today with a fever over 100.  The patient has had some left sided abdominal pain.  Because of his fever in this discomfort, CT scan was done revealing a 7 millimeter left distal ureteral stone.  Urine does not appear significantly infected, but with the stone and fever, urology was consult.  The patient is fairly alert and interactive.  He states that he has not had a kidney stone before.  He has had prostate cancer, and years ago underwent radical prostatectomy by Dr. Aurora Mask in [facility].  He denies recent nausea or vomiting.  He denies frequent urinary tract infections.  He takes tramadol for back pain, otherwise aches.  No significant medications.  He is retired Recruitment consultant.  His third wife died within the past 2 years.  PMH: Past Medical History:  Diagnosis Date  . Chronic back pain   . Gout attack   . Prostate cancer ([place])     PSH: Past Surgical History:  Procedure Laterality Date  . PROSTATECTOMY      Allergies: Allergies  Allergen Reactions  . Levothyroxine Anxiety, Palpitations and Shortness Of Breath    Dizziness  . Celecoxib Other (See Comments)    Hypertension  . Valdecoxib Other (See Comments)    Hypertension  . Blue Dyes (Parenteral) Other (See Comments)    unknown    Medications:  (Not in a hospital admission)   Social History: Social History   Social History  . Marital status: Married    Spouse name: N/A  . Number of children: N/A  . Years of education: N/A   Occupational History  . Not on file.   Social History Main Topics  . Smoking status: Never Smoker  . Smokeless tobacco: Never Used  . Alcohol use No  . Drug use: No  . Sexual activity: Not on file   Other Topics Concern  . Not on file   Social History Narrative  . No narrative on  file    Family History: History reviewed. No pertinent family history.  Review of Systems: Positive: fever, mild left lower quadrant pain. Negative:   A further 10 point review of systems was negative except what is listed in the HPI.  Physical Exam: @VITALS2 @ General: No acute distress.  Awake. Head:  Normocephalic.  Atraumatic. ENT:  EOMI.  Mucous membranes moist Neck:  Supple.  No lymphadenopathy. CV:  S1 present. S2 present. Regular rate. Pulmonary: Equal effort bilaterally.  Clear to auscultation bilaterally. Abdomen: Soft.  Lower midline abdominal scar.  Lower quadrant tenderness on the left.  No significant CVA tenderness. Skin:  Normal turgor.  No visible rash. Extremity: No gross deformity of bilateral upper extremities.  No gross deformity of    bilateral lower extremities. Neurologic: Alert. Appropriate mood.   Studies:  Recent Labs     10/04/16  2347  HGB  10.0*  WBC  10.5  PLT  230    Recent Labs     10/04/16  2347  NA  139  K  3.7  CL  111  CO2  18*  BUN  62*  CREATININE  2.47*  CALCIUM  8.9  GFRNONAA  21*  GFRAA  24*     No results for input(s): INR, APTT in the last 72 hours.  Invalid input(s): PT   Invalid input(s): ABG  I have reviewed the patient's laboratory studies as well as independently reviewed CT images.  Assessment:  Left distal ureteral stone with fever.  Urinalysis does not favor urinary tract infection, but with fever and stone, urgent therapy is necessary  Plan: I have spoken with the patient about his stone and possible infection, and have recommended at this point that we strongly consider cystoscopy and placement of a left double-J stent, followed down the road, after infection is cleared, by ureteroscopic stone extraction.  The procedure was discussed with him, and the patient elects to proceed.  I tried calling the patient's son, Mr. Megan Salon, but was given the wrong number.    Pager:[phone]

## 2016-10-05 NOTE — Anesthesia Postprocedure Evaluation (Signed)
Anesthesia Post Note  Patient: Frederick Allen  Procedure(s) Performed: Procedure(s) (LRB): CYSTOSCOPY WITH LEFT RETROGRADE PYELOGRAM/ LEFT URETERAL STENT PLACEMENT (Left)     Patient location during evaluation: PACU Anesthesia Type: MAC Level of consciousness: awake and alert Pain management: pain level controlled Vital Signs Assessment: post-procedure vital signs reviewed and stable Respiratory status: spontaneous breathing, nonlabored ventilation, respiratory function stable and patient connected to nasal cannula oxygen Cardiovascular status: stable and blood pressure returned to baseline Anesthetic complications: no    Last Vitals:  Vitals:   10/05/16 1115 10/05/16 1130  BP: 115/75 (!) 101/53  Pulse: (!) 50 (!) 45  Resp: (!) 22 19  Temp: 36.7 C 36.7 C  SpO2: 100% 100%    Last Pain:  Vitals:   10/05/16 0809  TempSrc:   PainSc: 5                  Saryah Loper S

## 2016-10-05 NOTE — ED Notes (Signed)
I-stat lactic resulted 1.45

## 2016-10-05 NOTE — Progress Notes (Signed)
A consult was received from an ED physician for vancomycin and zosyn per pharmacy dosing.  The patient's profile has been reviewed for ht/wt/allergies/indication/available labs.  ED RN reports patient height and weight of 5'8" and 73 kg.  Estimated CrCl~17 ml/min.  Zosyn 3.375g and Vanc 1g given early this morning. Patient remains in the ED.  Will place additional one time order for Zosyn 2.25g to be given this morning. No further vancomycin at this time.  Further antibiotics/pharmacy consults should be ordered by admitting physician if indicated.                       Thank you,  Hershal Coria, PharmD, BCPS Pager: [phone] 10/05/2016 6:38 AM

## 2016-10-05 NOTE — Anesthesia Procedure Notes (Signed)
Procedure Name: MAC Date/Time: 10/05/2016 9:23 AM Performed by: West Pugh Pre-anesthesia Checklist: Patient identified, Emergency Drugs available, Suction available, Patient being monitored and Timeout performed Patient Re-evaluated:Patient Re-evaluated prior to induction Oxygen Delivery Method: Nasal cannula Placement Confirmation: positive ETCO2,  CO2 detector and breath sounds checked- equal and bilateral Dental Injury: Teeth and Oropharynx as per pre-operative assessment

## 2016-10-06 ENCOUNTER — Encounter (HOSPITAL_COMMUNITY): Payer: Self-pay | Admitting: Urology

## 2016-10-06 DIAGNOSIS — Z8546 Personal history of malignant neoplasm of prostate: Secondary | ICD-10-CM | POA: Diagnosis not present

## 2016-10-06 DIAGNOSIS — N132 Hydronephrosis with renal and ureteral calculous obstruction: Secondary | ICD-10-CM | POA: Diagnosis present

## 2016-10-06 DIAGNOSIS — F0390 Unspecified dementia without behavioral disturbance: Secondary | ICD-10-CM | POA: Diagnosis not present

## 2016-10-06 DIAGNOSIS — G8929 Other chronic pain: Secondary | ICD-10-CM | POA: Diagnosis present

## 2016-10-06 DIAGNOSIS — N200 Calculus of kidney: Secondary | ICD-10-CM | POA: Diagnosis present

## 2016-10-06 DIAGNOSIS — Z9079 Acquired absence of other genital organ(s): Secondary | ICD-10-CM | POA: Diagnosis not present

## 2016-10-06 DIAGNOSIS — Z888 Allergy status to other drugs, medicaments and biological substances status: Secondary | ICD-10-CM | POA: Diagnosis not present

## 2016-10-06 DIAGNOSIS — L8990 Pressure ulcer of unspecified site, unspecified stage: Secondary | ICD-10-CM | POA: Diagnosis present

## 2016-10-06 LAB — URINE CULTURE
Culture: NO GROWTH
Culture: NO GROWTH

## 2016-10-06 MED ORDER — NITROFURANTOIN MONOHYD MACRO 100 MG PO CAPS: 100.0000 mg | ORAL_CAPSULE | Freq: Two times a day (BID) | ORAL | Status: DC

## 2016-10-06 MED ORDER — SACCHAROMYCES BOULARDII 250 MG PO CAPS
250.0000 mg | ORAL_CAPSULE | Freq: Two times a day (BID) | ORAL | Status: DC
  Administered 2016-10-06 – 2016-10-09 (×7): 250 mg via ORAL
  Filled 2016-10-06 (×7): qty 1

## 2016-10-06 NOTE — Clinical Social Work Note (Signed)
Clinical Social Work Assessment  Patient Details  Name: Frederick Allen MRN: [id number] Date of Birth: [birth year]  Date of referral:  10/06/16               Reason for consult:  Facility Placement                Permission sought to share information with:  Facility Sport and exercise psychologist, Family Supports Permission granted to share information::  Yes, Verbal Permission Granted  Name::     Frederick Allen; [place]::     Relationship::  step son; daughter in Financial trader Information:  [phone]; [phone]  Housing/Transportation Living arrangements for the past 2 months:  [place] of Information:  Patient, Adult Children Patient Interpreter Needed:  None Criminal Activity/Legal Involvement Pertinent to Current Situation/Hospitalization:  No - Comment as needed Significant Relationships:  Adult Children Lives with:  Facility Resident Do you feel safe going back to the place where you live?   (PT recommending SNF) Need for family participation in patient care:  Yes (Comment)  Care giving concerns: CSW contacted by patient's RN and informed that patient is from ALF and that patient may need SNF for ST rehab due to weakness. PT recommended SNF for ST rehab. Patient from [facility], staff reported that at baseline patient is independent with feeding, bathing and administering medications. Staff reported that patient was independent with ambulation prior to hospitalization.   Social Worker assessment / plan: CSW spoke with patient at bedside regarding PT recommendation for SNF, patient reported that he is agreeable. CSW and patient discussed his medicare insurance and coverage, patient reported that he is not able to private pay for SNF if insurance does not cover. CSW acknowledged patient's report about inability to private pay. Patient reported that he has been at current ALF for 1 year after leaving his home where he was residing  alone.CSW and patient discussed patient's stay at ALF, patient reported that it's nice and everything but he doesn't like it too well.  Patient reported that he lost his wife two years ago and became tearful, CSW acknowledged patient's feelings and provided emotional support. Patient granted verbal permission to CSW to contact his step son Frederick Allen [phone]) and step daughter Frederick Allen [phone]) to discuss discharge planning.   CSW contacted patient's step son and discussed PT recommendation. Patient's step son reported that he was agreeable and deferred to his wife to discuss details. Patient's step son's wife reported that she preferred that patient return to ALF with HHPT but is agreeable to PT recommendation for SNF. She reported that patient has been discharged from the hospital in the past back to ALF with HHPT, noting that patient has dme at ALF to assist him with completing ADLs. She reported that they prefer a SNF in [facility].   CSW will complete FL2 and provide bed offers. CSW will continue to follow to assist with dc planning.  Employment status:  Retired Forensic scientist:  Medicare PT Recommendations:  [place] / Referral to community resources:  [place]  Patient/Family's Response to care:  Patient/patient's family agreeable to SNF for ST rehab prior to returning to ALF.  Patient/Family's Understanding of and Emotional Response to Diagnosis, Current Treatment, and Prognosis:  Patient presented calm throughout assessment and became tearful when speaking about his late wife. CSW acknowledged patient's feelings and inquired about his relationship with his wife, patient reported that they were married  for 53 years. CSW provided patient with emotional support. Patient's step son's wife verbalized understanding of patient's diagnosis, current treatment and prognosis, noting she is in the medical field. Patient's step son's wife  reported that she prefers a SNF in [place] or close to [employer].   Emotional Assessment Appearance:  Appears stated age Attitude/Demeanor/Rapport:  Other (Cooperative) Affect (typically observed):  Calm, Tearful/Crying Orientation:  Oriented to Self, Oriented to Place, Oriented to Situation Alcohol / Substance use:  Not Applicable Psych involvement (Current and /or in the community):  No (Comment)  Discharge Needs  Concerns to be addressed:  No discharge needs identified Readmission within the last 30 days:  No Current discharge risk:  Dependent with Mobility Barriers to Discharge:  Continued Medical Work up   [employer], LCSW 10/06/2016, 2:43 PM

## 2016-10-06 NOTE — Evaluation (Signed)
Physical Therapy Evaluation Patient Details Name: Frederick Allen MRN: [id number] DOB: 1917/02/04 Today's Date: 10/06/2016   History of Present Illness  Pt is a 83 year old male s/p cystoscopy, left retrograde ureteropyelogram with fluoroscopic interpretation, placement of 6 French by 26 centimeter contour double-J stent without tether on 10/05/16.  PMHx: gout, chronic back pain, prostate cancer  Clinical Impression  Pt admitted with above diagnosis. Pt currently with functional limitations due to the deficits listed below (see PT Problem List).  Pt will benefit from skilled PT to increase their independence and safety with mobility to allow discharge to the venue listed below.   Pt presents with decreased mobility and generalized weakness.  Pt requiring +2 assist for transfers at this time.  Pt ambulatory with RW at baseline.  Recommend d/c to SNF prior to return to ALF.     Follow Up Recommendations SNF;Supervision/Assistance - 24 hour    Equipment Recommendations  Wheelchair (measurements PT);Wheelchair cushion (measurements PT)    Recommendations for Other Services       Precautions / Restrictions Precautions Precautions: Fall Precaution Comments: c/o chronic R knee pain      Mobility  Bed Mobility Overal bed mobility: Needs Assistance Bed Mobility: Supine to Sit;Sit to Supine     Supine to sit: Max assist Sit to supine: Mod assist   General bed mobility comments: assist for LEs, assist for trunk upright  Transfers Overall transfer level: Needs assistance Equipment used: Rolling walker (2 wheeled) Transfers: Sit to/from [facility] Sit to Stand: Max assist;+2 physical assistance Stand pivot transfers: Total assist;+2 physical assistance       General transfer comment: pt requiring increased assist for transfers, max-total assist from bed to [facility], utilized stedy for return to bed, pt also performed another sit to stand with stedy for strengthening - able  to march in place with elbows resting on stedy  Ambulation/Gait                Stairs            Wheelchair Mobility    Modified Rankin (Stroke Patients Only)       Balance Overall balance assessment: Needs assistance         Standing balance support: Bilateral upper extremity supported Standing balance-Leahy Scale: Zero                               Pertinent Vitals/Pain Pain Assessment: No/denies pain    Home Living Family/patient expects to be discharged to:: Assisted living               Home Equipment: Walker - 2 wheels      Prior Function Level of Independence: Independent with assistive device(s)         Comments: pt reports he performs his ADLs and is ambulatory with RW     Hand Dominance        Extremity/Trunk Assessment   Upper Extremity Assessment Upper Extremity Assessment: Generalized weakness    Lower Extremity Assessment Lower Extremity Assessment: Generalized weakness    Cervical / Trunk Assessment Cervical / Trunk Assessment: Kyphotic  Communication   Communication: No difficulties  Cognition Arousal/Alertness: Awake/alert   Overall Cognitive Status: Within Functional Limits for tasks assessed                                 General  Comments: pt tearful during session, upset with his weakness, decline in mobility      General Comments      Exercises     Assessment/Plan    PT Assessment Patient needs continued PT services  PT Problem List Decreased strength;Decreased mobility;Decreased activity tolerance;Decreased balance;Decreased knowledge of use of DME       PT Treatment Interventions Gait training;DME instruction;Therapeutic activities;Therapeutic exercise;Patient/family education;Functional mobility training;Balance training;Wheelchair mobility training    PT Goals (Current goals can be found in the Care Plan section)  Acute Rehab PT Goals PT Goal Formulation: With  patient Time For Goal Achievement: 10/20/16 Potential to Achieve Goals: Good    Frequency Min 3X/week   Barriers to discharge        Co-evaluation               AM-PAC PT "6 Clicks" Daily Activity  Outcome Measure Difficulty turning over in bed (including adjusting bedclothes, sheets and blankets)?: Unable Difficulty moving from lying on back to sitting on the side of the bed? : Unable Difficulty sitting down on and standing up from a chair with arms (e.g., wheelchair, bedside commode, etc,.)?: Unable Help needed moving to and from a bed to chair (including a wheelchair)?: Total Help needed walking in hospital room?: Total Help needed climbing 3-5 steps with a railing? : Total 6 Click Score: 6    End of Session Equipment Utilized During Treatment: Gait belt Activity Tolerance: Patient tolerated treatment well Patient left: with call bell/phone within reach;in bed;with bed alarm set Nurse Communication: Mobility status;Need for lift equipment PT Visit Diagnosis: Difficulty in walking, not elsewhere classified (R26.2);Muscle weakness (generalized) (M62.81)    Time: 9407-6808 PT Time Calculation (min) (ACUTE ONLY): 30 min   Charges:   PT Evaluation $PT Eval Low Complexity: 1 Low PT Treatments $Therapeutic Activity: 8-22 mins   PT G Codes:        Carmelia Bake, PT, DPT 10/06/2016 Pager: [phone]  York Ram E 10/06/2016, 12:40 PM

## 2016-10-06 NOTE — Progress Notes (Signed)
1 Day Post-Op Subjective: Patient reports feeling weak.  He is having multiple stools.  C. Difficile is negative.  Objective: Vital signs in last 24 hours: Temp:  [97.9 F (36.6 C)-99.7 F (37.6 C)] 99.3 F (37.4 C) (09/10 0408) Pulse Rate:  [43-60] 51 (09/10 0408) Resp:  [16-22] 17 (09/10 0035) BP: (89-137)/(48-95) 124/50 (09/10 0408) SpO2:  [97 %-100 %] 100 % (09/10 0408)  Intake/Output from previous day: 09/09 0701 - 09/10 0700 In: 1985 [I.V.:1985] Out: 300 [Urine:300] Intake/Output this shift: Total I/O In: 350 [P.O.:200; I.V.:150] Out: -   Physical Exam:  Constitutional: Vital signs reviewed. WD WN in NAD   Eyes: PERRL, No scleral icterus.   Cardiovascular: RRR Pulmonary/Chest: Normal effort Extremities: No cyanosis or edema   Lab Results:  Recent Labs  10/04/16 2347  HGB 10.0*  HCT 30.0*   BMET  Recent Labs  10/04/16 2347  NA 139  K 3.7  CL 111  CO2 18*  GLUCOSE 180*  BUN 62*  CREATININE 2.47*  CALCIUM 8.9   No results for input(s): LABPT, INR in the last 72 hours. No results for input(s): LABURIN in the last 72 hours. Results for orders placed or performed during the hospital encounter of 10/04/16  Urine culture     Status: None   Collection Time: 10/05/16  1:53 AM  Result Value Ref Range Status   Specimen Description URINE, RANDOM  Final   Special Requests NONE  Final   Culture   Final    NO GROWTH Performed at [facility], [address] [place], [place] [address]    Report Status 10/06/2016 FINAL  Final  Urine Culture     Status: None   Collection Time: 10/05/16  9:49 AM  Result Value Ref Range Status   Specimen Description CYSTOSCOPY  Final   Special Requests NONE  Final   Culture   Final    NO GROWTH Performed at [facility], [address], [place], [place] [address]    Report Status 10/06/2016 FINAL  Final  MRSA PCR Screening     Status: None   Collection Time: 10/05/16  1:58 PM  Result Value Ref Range Status    MRSA by PCR NEGATIVE NEGATIVE Final    Comment:        The GeneXpert MRSA Assay (FDA approved for NASAL specimens only), is one component of a comprehensive MRSA colonization surveillance program. It is not intended to diagnose MRSA infection nor to guide or monitor treatment for MRSA infections. Performed at [facility], [place] [address] [place], [place] [address]   C difficile quick scan w PCR reflex     Status: None   Collection Time: 10/05/16  4:28 PM  Result Value Ref Range Status   C Diff antigen NEGATIVE NEGATIVE Final   C Diff toxin NEGATIVE NEGATIVE Final   C Diff interpretation No C. difficile detected.  Final    Studies/Results: Ct Abdomen Pelvis Wo Contrast  Result Date: 10/05/2016 CLINICAL DATA:  Weak, lethargy, fever, abdominal pain. History of prostate cancer. EXAM: CT ABDOMEN AND PELVIS WITHOUT CONTRAST TECHNIQUE: Multidetector CT imaging of the abdomen and pelvis was performed following the standard protocol without IV contrast. COMPARISON:  CT abdomen and pelvis report dated March 13, 2002 though images are not available for direct comparison. FINDINGS: LOWER CHEST: Lung bases are clear. Heart size is mildly enlarged. Moderate coronary artery calcifications. No pericardial effusion. Hepatic diaphragm attic eventration. HEPATOBILIARY: Status post cholecystectomy. Normal  noncontrast CT liver. PANCREAS: Normal. SPLEEN: Normal. ADRENALS/URINARY TRACT: Kidneys are orthotopic, mildly atrophic LEFT kidney. Multiple lobulated LEFT renal cysts measuring to 4.8 cm. Duplicated LEFT renal collecting system to the ureterovesicular junction where a 7 mm calculus is present. Moderate LEFT hydronephrosis. Punctate LEFT upper pole nephrolithiasis. Limited assessment for renal masses on this nonenhanced examination. Urinary bladder is decompressed and unremarkable. Normal adrenal glands. STOMACH/BOWEL: The stomach, small and large bowel are normal in course and caliber without  inflammatory changes, sensitivity decreased by lack of enteric contrast. Air-fluid levels in the small and large bowel. Mild colonic diverticulosis. Status post ileo cecectomy. VASCULAR/LYMPHATIC: Aortoiliac vessels are normal in caliber. Mild calcific atherosclerosis. No lymphadenopathy by CT size criteria. REPRODUCTIVE: Prostatectomy. OTHER: No intraperitoneal free fluid or free air. MUSCULOSKELETAL: Non-acute. Severe degenerative change of the spine. Anterior abdominal wall scarring. IMPRESSION: 1. 7 mm LEFT ureterovesicular junction calculus with moderate obstructive uropathy. Duplicated LEFT renal collecting system. 2. Small and large bowel air-fluid levels seen with enteritis. No bowel obstruction. Postsurgical changes RIGHT colon. Aortic Atherosclerosis (ICD10-I70.0). Electronically Signed   By: Elon Alas M.D.   On: 10/05/2016 03:11   Dg Chest 2 View  Result Date: 10/05/2016 CLINICAL DATA:  Sepsis fever EXAM: CHEST  2 VIEW COMPARISON:  06/11/2016, 04/01/2014 FINDINGS: Stable elevation of right diaphragm. Streaky atelectasis at the left base. Stable cardiomediastinal silhouette. No pneumothorax. No significant pleural effusion or new consolidation. IMPRESSION: No active cardiopulmonary disease. Electronically Signed   By: Donavan Foil M.D.   On: 10/05/2016 00:04   Dg C-arm 1-60 Min-no Report  Result Date: 10/05/2016 Fluoroscopy was utilized by the requesting physician.  No radiographic interpretation.    Assessment/Plan:   Postop day 1 stenting for possible infected left stone.  The patient has been afebrile.  Urine cultures are negative.  He is weak, and I would consider sending him to the skilled care division of hisassisted care facility.  We will work on getting that set up. I will stop antibiotics.   LOS: 0 days   Franchot Gallo M 10/06/2016, 10:13 AM

## 2016-10-06 NOTE — NC FL2 (Signed)
  [place] LEVEL OF CARE SCREENING TOOL     IDENTIFICATION  Patient Name: Frederick Allen Birthdate: [birth year] Sex: male Admission Date (Current Location): 10/04/2016  [facility] and [place] Number:  Herbalist and Address:  [facility],  [place] [address] [place]      Provider Number: [id number]  Attending Physician Name and Address:  Franchot Gallo, MD  Relative Name and Phone Number:       Current Level of Care: Hospital Recommended Level of Care: [place] Prior Approval Number:    Date Approved/Denied:   PASRR Number: [id number]  Discharge Plan: SNF    Current Diagnoses: Patient Active Problem List   Diagnosis Date Noted  . Calculus of ureter 10/05/2016  . Pressure injury of skin 10/05/2016    Orientation RESPIRATION BLADDER Height & Weight     Self, Situation, Place  Normal Incontinent, External catheter Weight: 160 lb (72.6 kg) Height:  5\' 8"  (172.7 cm)  BEHAVIORAL SYMPTOMS/MOOD NEUROLOGICAL BOWEL NUTRITION STATUS      Incontinent Diet (regular)  AMBULATORY STATUS COMMUNICATION OF NEEDS Skin   Extensive Assist Verbally Other (Comment) (PressureInjuryStageII-Partialthicknesslossofdermispresentingasashallowopenulcerwithared,pinkwoundbedwithoutslough. Location:Right;Left Sacrum Foam dressing)       Pressure Injury 10/05/16 Stage I -  Intact skin with non-blanchable redness of a localized area usually over a bony prominence.   Location: Heel Location Orientation: Bilateral Foam Dressing                   Personal Care Assistance Level of Assistance  Bathing, Feeding, Dressing Bathing Assistance: Maximum assistance Feeding assistance: Independent Dressing Assistance: Maximum assistance     Functional Limitations Info             [place]  PT (By licensed PT), OT (By licensed OT)     PT Frequency: 5x OT Frequency: 5x             Contractures Contractures Info: Not present    Additional Factors Info  Code Status, Allergies Code Status Info: Full Code Allergies Info: Levothyroxine,Celecoxib,Valdecoxib,Blue Dyes (parenteral)           Current Medications (10/06/2016):  This is the current hospital active medication list Current Facility-Administered Medications  Medication Dose Route Frequency Provider Last Rate Last Dose  . 0.45 % sodium chloride infusion   Intravenous Continuous Franchot Gallo, MD 50 mL/hr at 10/06/16 0324    . acetaminophen (TYLENOL) tablet 650 mg  650 mg Oral Q4H PRN Franchot Gallo, MD      . enoxaparin (LOVENOX) injection 30 mg  30 mg Subcutaneous Q24H Franchot Gallo, MD   30 mg at 10/05/16 2137  . oxybutynin (DITROPAN) tablet 5 mg  5 mg Oral Q8H PRN Franchot Gallo, MD      . saccharomyces boulardii (FLORASTOR) capsule 250 mg  250 mg Oral BID Franchot Gallo, MD   250 mg at 10/06/16 0958  . traMADol (ULTRAM) tablet 50 mg  50 mg Oral Q12H PRN Franchot Gallo, MD         Discharge Medications: Please see discharge summary for a list of discharge medications.  Relevant Imaging Results:  Relevant Lab Results:   Additional Information SSN [id number]  Burnis Medin, LCSW

## 2016-10-06 NOTE — Care Management Note (Signed)
Case Management Note  Patient Details  Name: Frederick Allen MRN: [id number] Date of Birth: [birth year]  Subjective/Objective: 83 y/o m admitted w/L ureter stone. From ALF-[facility]. Has son/dtr in law support(do not live w/patient). Spoke to dtr in law [name] c#[phone]-she hopes he can return back to ALF-they have their own PT/OT, but if he needs higher level then she agrees to SNF. Will await PT recc. CSW already following.                   Action/Plan:d/c plan ALF   Expected Discharge Date:                  Expected Discharge Plan:  Assisted Living / Rest Home  In-House Referral:  Clinical Social Work  Discharge planning Services  CM Consult  Post Acute Care Choice:    Choice offered to:     DME Arranged:    DME Agency:     HH Arranged:    HH Agency:     Status of Service:  In process, will continue to follow  If discussed at Long Length of Stay Meetings, dates discussed:    Additional Comments:  Dessa Phi, RN 10/06/2016, 12:40 PM

## 2016-10-06 NOTE — Discharge Instructions (Signed)

## 2016-10-07 MED ORDER — VITAMINS A & D EX OINT
TOPICAL_OINTMENT | CUTANEOUS | Status: AC
  Administered 2016-10-07: 1
  Filled 2016-10-07: qty 5

## 2016-10-07 NOTE — Discharge Summary (Addendum)
Patient ID: Frederick Allen MRN: [id number] DOB/AGE: [birth year] 81 y.o.  Admit date: 10/04/2016 Discharge date: 10/07/2016  Primary Care Physician:  Derrill Center., MD  Discharge Diagnoses:  Renal insufficiency Present on Admission: . Calculus of ureter Hydronephrosis Renal insufficiency, acute  Consults:  None   Discharge Medications:    Significant Diagnostic Studies:  Ct Abdomen Pelvis Wo Contrast  Result Date: 10/05/2016 CLINICAL DATA:  Weak, lethargy, fever, abdominal pain. History of prostate cancer. EXAM: CT ABDOMEN AND PELVIS WITHOUT CONTRAST TECHNIQUE: Multidetector CT imaging of the abdomen and pelvis was performed following the standard protocol without IV contrast. COMPARISON:  CT abdomen and pelvis report dated March 13, 2002 though images are not available for direct comparison. FINDINGS: LOWER CHEST: Lung bases are clear. Heart size is mildly enlarged. Moderate coronary artery calcifications. No pericardial effusion. Hepatic diaphragm attic eventration. HEPATOBILIARY: Status post cholecystectomy. Normal noncontrast CT liver. PANCREAS: Normal. SPLEEN: Normal. ADRENALS/URINARY TRACT: Kidneys are orthotopic, mildly atrophic LEFT kidney. Multiple lobulated LEFT renal cysts measuring to 4.8 cm. Duplicated LEFT renal collecting system to the ureterovesicular junction where a 7 mm calculus is present. Moderate LEFT hydronephrosis. Punctate LEFT upper pole nephrolithiasis. Limited assessment for renal masses on this nonenhanced examination. Urinary bladder is decompressed and unremarkable. Normal adrenal glands. STOMACH/BOWEL: The stomach, small and large bowel are normal in course and caliber without inflammatory changes, sensitivity decreased by lack of enteric contrast. Air-fluid levels in the small and large bowel. Mild colonic diverticulosis. Status post ileo cecectomy. VASCULAR/LYMPHATIC: Aortoiliac vessels are normal in caliber. Mild calcific atherosclerosis. No lymphadenopathy  by CT size criteria. REPRODUCTIVE: Prostatectomy. OTHER: No intraperitoneal free fluid or free air. MUSCULOSKELETAL: Non-acute. Severe degenerative change of the spine. Anterior abdominal wall scarring. IMPRESSION: 1. 7 mm LEFT ureterovesicular junction calculus with moderate obstructive uropathy. Duplicated LEFT renal collecting system. 2. Small and large bowel air-fluid levels seen with enteritis. No bowel obstruction. Postsurgical changes RIGHT colon. Aortic Atherosclerosis (ICD10-I70.0). Electronically Signed   By: Elon Alas M.D.   On: 10/05/2016 03:11   Dg Chest 2 View  Result Date: 10/05/2016 CLINICAL DATA:  Sepsis fever EXAM: CHEST  2 VIEW COMPARISON:  06/11/2016, 04/01/2014 FINDINGS: Stable elevation of right diaphragm. Streaky atelectasis at the left base. Stable cardiomediastinal silhouette. No pneumothorax. No significant pleural effusion or new consolidation. IMPRESSION: No active cardiopulmonary disease. Electronically Signed   By: Donavan Foil M.D.   On: 10/05/2016 00:04   Dg C-arm 1-60 Min-no Report  Result Date: 10/05/2016 Fluoroscopy was utilized by the requesting physician.  No radiographic interpretation.    Brief H and P: For complete details please refer to admission H and P, but in brief the pt was admitted for urgent mgmt of a left ureteral stone with associated hydronephrosis, h/o fever as well as possible UTI  Hospital Course: Pt was admitted and underwent urgent left ureteral stent placement. He remained afebrile, and all cultures were negative. No abx were administered. He had preexisting loose stools, C diff screen negative. Active Problems:   Calculus of ureter   Pressure injury of skin   Day of Discharge BP 117/67 (BP Location: Left Arm)   Pulse (!) 53   Temp 99 F (37.2 C) (Oral)   Resp 16   Ht 5\' 8"  (1.727 m)   Wt 72.6 kg (160 lb)   SpO2 99%   BMI 24.33 kg/m   No results found for this or any previous visit (from the past 24 hour(s)).  Physical  Exam: General: Alert and awake  oriented x3 not in any acute distress. HEENT: anicteric sclera, pupils reactive to light and accommodation CVS: S1-S2 clear no murmur rubs or gallops Chest: clear to auscultation bilaterally, no wheezing rales or rhonchi Abdomen: soft nontender, nondistended, normal bowel sounds, no organomegaly Extremities: no cyanosis, clubbing or edema noted bilaterally Neuro: Cranial nerves II-XII intact, no focal neurological deficits  Disposition:  SNF due to weakness  Diet:  Regular  Activity:  Gradually increase w/ PT     DISCHARGE FOLLOW-UP Follow-up Information    [facility] Follow up.   Why:  we will call to set up followup appt to discuss stone removal procedure Contact information: [place] [address] [phone]           Time spent on Discharge:  15 mins  Signed: Jorja Loa 10/07/2016, 8:07 AM

## 2016-10-08 ENCOUNTER — Inpatient Hospital Stay (HOSPITAL_COMMUNITY): Payer: Medicare Other

## 2016-10-08 MED ORDER — MAGNESIUM CITRATE PO SOLN: 0.5000 | Freq: Once | ORAL | Status: DC

## 2016-10-08 MED ORDER — MAGNESIUM CITRATE PO SOLN: 0.5000 | Freq: Every day | ORAL | Status: DC | PRN

## 2016-10-08 NOTE — Progress Notes (Signed)
3 Days Post-Op Subjective: Patient reports no bowel movements in a couple of days.  He did have a temperature to 100.9 yesterday.  He was in retention and a catheter was placed early this morning.  Objective: Vital signs in last 24 hours: Temp:  [97.9 F (36.6 C)-100.9 F (38.3 C)] 99.5 F (37.5 C) (09/12 0426) Pulse Rate:  [55-62] 55 (09/12 0426) Resp:  [17-18] 18 (09/12 0426) BP: (150-158)/(68-73) 150/70 (09/12 0426) SpO2:  [98 %-100 %] 98 % (09/12 0426)  Intake/Output from previous day: 09/11 0701 - 09/12 0700 In: 1920 [P.O.:720; I.V.:1200] Out: 1675 [Urine:1675] Intake/Output this shift: No intake/output data recorded.  Physical Exam:  Constitutional: Vital signs reviewed. WD WN in NAD   Eyes: PERRL, No scleral icterus.   Cardiovascular: RRR Pulmonary/Chest: Normal effort Abdominal: Soft. Non-tender, non-distended, bowel sounds are normal, no masses, organomegaly, or guarding present.  Genitourinary: Extremities: No cyanosis or edema   Lab Results: No results for input(s): HGB, HCT in the last 72 hours. BMET No results for input(s): NA, K, CL, CO2, GLUCOSE, BUN, CREATININE, CALCIUM in the last 72 hours. No results for input(s): LABPT, INR in the last 72 hours. No results for input(s): LABURIN in the last 72 hours. Results for orders placed or performed during the hospital encounter of 10/04/16  Blood Culture (routine x 2)     Status: None (Preliminary result)   Collection Time: 10/05/16 12:13 AM  Result Value Ref Range Status   Specimen Description BLOOD BLOOD RIGHT FOREARM  Final   Special Requests IN PEDIATRIC BOTTLE Blood Culture adequate volume  Final   Culture   Final    NO GROWTH 2 DAYS Performed at [facility], [place] [address], [place], [place] [address]    Report Status PENDING  Incomplete  Blood Culture (routine x 2)     Status: None (Preliminary result)   Collection Time: 10/05/16 12:13 AM  Result Value Ref Range Status   Specimen Description  BLOOD BLOOD LEFT FOREARM  Final   Special Requests   Final    BOTTLES DRAWN AEROBIC AND ANAEROBIC Blood Culture adequate volume   Culture   Final    NO GROWTH 2 DAYS Performed at [facility], [place] [address], [place], [place] [address]    Report Status PENDING  Incomplete  Urine culture     Status: None   Collection Time: 10/05/16  1:53 AM  Result Value Ref Range Status   Specimen Description URINE, RANDOM  Final   Special Requests NONE  Final   Culture   Final    NO GROWTH Performed at [facility], [address] [place], [place] [address]    Report Status 10/06/2016 FINAL  Final  Urine Culture     Status: None   Collection Time: 10/05/16  9:49 AM  Result Value Ref Range Status   Specimen Description CYSTOSCOPY  Final   Special Requests NONE  Final   Culture   Final    NO GROWTH Performed at [facility], [address] [place], [place] [address]    Report Status 10/06/2016 FINAL  Final  MRSA PCR Screening     Status: None   Collection Time: 10/05/16  1:58 PM  Result Value Ref Range Status   MRSA by PCR NEGATIVE NEGATIVE Final    Comment:        The GeneXpert MRSA Assay (FDA approved for NASAL specimens only), is one component of a comprehensive MRSA colonization  surveillance program. It is not intended to diagnose MRSA infection nor to guide or monitor treatment for MRSA infections. Performed at [facility], [place] [address], [place], [place] [address]   C difficile quick scan w PCR reflex     Status: None   Collection Time: 10/05/16  4:28 PM  Result Value Ref Range Status   C Diff antigen NEGATIVE NEGATIVE Final   C Diff toxin NEGATIVE NEGATIVE Final   C Diff interpretation No C. difficile detected.  Final    Studies/Results: No results found.  Assessment/Plan:   Status post left ureteral stent placement for kidney stone.  Cultures have been negative, but he did have a temperature yesterday.  He is in retention and catheter was  placed.    We will get him out of bed.  Physical therapy consultation.  Laxative.  Possible discharge/transfer to skilled nursing facility tomorrow.   LOS: 2 days   Franchot Gallo M 10/08/2016, 8:06 AM

## 2016-10-08 NOTE — Progress Notes (Signed)
Patient complaining of bladder discomfort and feeling like he was not emptying his bladder. Bladder scan performed and showed greater than 946ml. MD on call was notified and order received to place 56f coude foley catheter. Patient tolerated insertion well; immediate return of light Frederick Allen/ tea colored urine. Will continue to monitor patient.

## 2016-10-08 NOTE — Progress Notes (Signed)
Physical Therapy Treatment Patient Details Name: Frederick Allen MRN: [id number] DOB: 03/29/1917 Today's Date: 10/08/2016    History of Present Illness Pt is a 83 year old male s/p cystoscopy, left retrograde ureteropyelogram with fluoroscopic interpretation, placement of 6 French by 26 centimeter contour double-J stent without tether on 10/05/16.  PMHx: gout, chronic back pain, prostate cancer    PT Comments    Pt assisted back to bed from recliner using Stedy for safety.  Pt reports not feeling well today and shivering upon sitting EOB, also states feeling cold.  Requested NT check temp: 100.3 *F.  Continue to recommend SNF upon d/c.   Follow Up Recommendations  SNF;Supervision/Assistance - 24 hour     Equipment Recommendations  Wheelchair (measurements PT);Wheelchair cushion (measurements PT)    Recommendations for Other Services       Precautions / Restrictions Precautions Precautions: Fall Precaution Comments: c/o chronic R knee pain    Mobility  Bed Mobility Overal bed mobility: Needs Assistance Bed Mobility: Sit to Supine       Sit to supine: Mod assist   General bed mobility comments: assist for LEs  Transfers Overall transfer level: Needs assistance Equipment used: Rolling walker (2 wheeled) Transfers: Sit to/from Stand Sit to Stand: Max assist;+2 physical assistance Stand pivot transfers: Total assist       General transfer comment: pt requiring max assist +2 to stand from recliner on stedy, verbal cues for pt to self assist, pt reports increased weakness, utilized stedy for pivot back to bed, pt able ot better assist from raised stedy position and assisted with controlling descent to sitting on bed; pt declined performing more sit to stands for strengthening  Ambulation/Gait                 Stairs            Wheelchair Mobility    Modified Rankin (Stroke Patients Only)       Balance                                             Cognition Arousal/Alertness: Awake/alert Behavior During Therapy: Flat affect Overall Cognitive Status: Within Functional Limits for tasks assessed                                        Exercises General Exercises - Lower Extremity Long Arc Quad: AROM;Both;10 reps;Seated    General Comments        Pertinent Vitals/Pain Pain Assessment: No/denies pain    Home Living                      Prior Function            PT Goals (current goals can now be found in the care plan section) Progress towards PT goals: Progressing toward goals    Frequency    Min 3X/week      PT Plan Current plan remains appropriate    Co-evaluation              AM-PAC PT "6 Clicks" Daily Activity  Outcome Measure  Difficulty turning over in bed (including adjusting bedclothes, sheets and blankets)?: Unable Difficulty moving from lying on back to sitting on the side of the bed? : Unable Difficulty  sitting down on and standing up from a chair with arms (e.g., wheelchair, bedside commode, etc,.)?: Unable Help needed moving to and from a bed to chair (including a wheelchair)?: Total Help needed walking in hospital room?: Total Help needed climbing 3-5 steps with a railing? : Total 6 Click Score: 6    End of Session   Activity Tolerance: Patient limited by fatigue Patient left: with call bell/phone within reach;in bed;with bed alarm set Nurse Communication: Mobility status;Need for lift equipment PT Visit Diagnosis: Difficulty in walking, not elsewhere classified (R26.2);Muscle weakness (generalized) (M62.81)     Time: 1141-1200 PT Time Calculation (min) (ACUTE ONLY): 19 min  Charges:  $Therapeutic Activity: 8-22 mins                    G Codes:      Carmelia Bake, PT, DPT 10/08/2016 Pager: [phone]  York Ram E 10/08/2016, 2:28 PM

## 2016-10-08 NOTE — Clinical Social Work Placement (Signed)
   CLINICAL SOCIAL WORK PLACEMENT  NOTE  Date:  10/08/2016  Patient Details  Name: Frederick Allen MRN: [id number] Date of Birth: 1917/02/23  Clinical Social Work is seeking post-discharge placement for this patient at the [place] level of care (*CSW will initial, date and re-position this form in  chart as items are completed):  Yes   Patient/family provided with [facility] Work Department's list of facilities offering this level of care within the geographic area requested by the patient (or if unable, by the patient's family).  Yes   Patient/family informed of their freedom to choose among providers that offer the needed level of care, that participate in Medicare, Medicaid or managed care program needed by the patient, have an available bed and are willing to accept the patient.  Yes   Patient/family informed of [facility]'s ownership interest in [facility] and [facility], as well as of the fact that they are under no obligation to receive care at these facilities.  PASRR submitted to EDS on       PASRR number received on       Existing PASRR number confirmed on 10/06/16     FL2 transmitted to all facilities in geographic area requested by pt/family on 10/06/16     FL2 transmitted to all facilities within larger geographic area on       Patient informed that his/her managed care company has contracts with or will negotiate with certain facilities, including the following:        Yes   Patient/family informed of bed offers received.  Patient chooses bed at [facility]     Physician recommends and patient chooses bed at      Patient to be transferred to   on  .  Patient to be transferred to facility by       Patient family notified on   of transfer.  Name of family member notified:        PHYSICIAN       Additional Comment:    _______________________________________________ Burnis Medin, LCSW 10/08/2016, 2:28  PM

## 2016-10-08 NOTE — Progress Notes (Signed)
Patient with temperature of 100.3 again today. Tylenol given and after about 2 hours temp came down to 98.3. Patient given incentive spirometry and instructed on use. Patient demonstrated proper use, but will need frequent reminders from staff. Will continue to monitor for fevers and encourage IS use. Patient was out of bed to chair for about 3 hours today as well. Still very weak and requires maximum 2 person assist with the steady lift device to stand and is not able to ambulate at this time.    Othella Boyer [facility] 10/08/2016

## 2016-10-09 MED ORDER — SACCHAROMYCES BOULARDII 250 MG PO CAPS: 250.0000 mg | ORAL_CAPSULE | Freq: Two times a day (BID) | ORAL | Status: AC

## 2016-10-09 NOTE — Clinical Social Work Placement (Signed)
Patient received and accepted bed offer at [facility]. Facility aware of patient's discharge and confirmed patient's ability to arrive. PTAR contacted, patient's family notified. Patient's RN can call report to [phone], packet complete. CSW signing off, no other needs identified at this time.  CLINICAL SOCIAL WORK PLACEMENT  NOTE  Date:  10/09/2016  Patient Details  Name: Frederick Allen MRN: [id number] Date of Birth: Sep 05, 1917  Clinical Social Work is seeking post-discharge placement for this patient at the [place] level of care (*CSW will initial, date and re-position this form in  chart as items are completed):  Yes   Patient/family provided with [facility] Work Department's list of facilities offering this level of care within the geographic area requested by the patient (or if unable, by the patient's family).  Yes   Patient/family informed of their freedom to choose among providers that offer the needed level of care, that participate in Medicare, Medicaid or managed care program needed by the patient, have an available bed and are willing to accept the patient.  Yes   Patient/family informed of [facility]'s ownership interest in [facility] and [facility], as well as of the fact that they are under no obligation to receive care at these facilities.  PASRR submitted to EDS on       PASRR number received on       Existing PASRR number confirmed on 10/06/16     FL2 transmitted to all facilities in geographic area requested by pt/family on 10/06/16     FL2 transmitted to all facilities within larger geographic area on       Patient informed that his/her managed care company has contracts with or will negotiate with [facility] facilities, including the following:        Yes   Patient/family informed of bed offers received.  Patient chooses bed at [facility]     Physician recommends and patient chooses bed at      Patient  to be transferred to [facility] on 10/09/16.  Patient to be transferred to facility by PTAR     Patient family notified on 10/09/16 of transfer.  Name of family member notified:  [name]     PHYSICIAN       Additional Comment:    _______________________________________________ Burnis Medin, LCSW 10/09/2016, 12:49 PM

## 2016-10-09 NOTE — Progress Notes (Signed)
4 Days Post-Op Subjective: Patient reports feeling weak but no pain  Objective: Vital signs in last 24 hours: Temp:  [98.3 F (36.8 C)-100.3 F (37.9 C)] 99.1 F (37.3 C) (09/13 0426) Pulse Rate:  [60-70] 60 (09/13 0426) Resp:  [19-23] 20 (09/13 0426) BP: (111-119)/(47-60) 119/59 (09/13 0426) SpO2:  [98 %-99 %] 99 % (09/13 0426)  Intake/Output from previous day: 09/12 0701 - 09/13 0700 In: 0258 [P.O.:240; I.V.:950] Out: 758 [Urine:758] Intake/Output this shift: No intake/output data recorded.  Physical Exam:  Constitutional: Vital signs reviewed. WD WN in NAD   Eyes: PERRL, No scleral icterus.   Cardiovascular: RRR Pulmonary/Chest: Normal effort Abdominal: Soft. Non-tender, non-distended, bowel sounds are normal, no masses, organomegaly, or guarding present.  Genitourinary: Extremities: No cyanosis or edema   Lab Results: No results for input(s): HGB, HCT in the last 72 hours. BMET No results for input(s): NA, K, CL, CO2, GLUCOSE, BUN, CREATININE, CALCIUM in the last 72 hours. No results for input(s): LABPT, INR in the last 72 hours. No results for input(s): LABURIN in the last 72 hours. Results for orders placed or performed during the hospital encounter of 10/04/16  Blood Culture (routine x 2)     Status: None (Preliminary result)   Collection Time: 10/05/16 12:13 AM  Result Value Ref Range Status   Specimen Description BLOOD BLOOD RIGHT FOREARM  Final   Special Requests IN PEDIATRIC BOTTLE Blood Culture adequate volume  Final   Culture   Final    NO GROWTH 3 DAYS Performed at [facility], [place] [address], [place], [place] [address]    Report Status PENDING  Incomplete  Blood Culture (routine x 2)     Status: None (Preliminary result)   Collection Time: 10/05/16 12:13 AM  Result Value Ref Range Status   Specimen Description BLOOD BLOOD LEFT FOREARM  Final   Special Requests   Final    BOTTLES DRAWN AEROBIC AND ANAEROBIC Blood Culture adequate volume   Culture   Final    NO GROWTH 3 DAYS Performed at [facility], [place] [address] [place], [place] [address]    Report Status PENDING  Incomplete  Urine culture     Status: None   Collection Time: 10/05/16  1:53 AM  Result Value Ref Range Status   Specimen Description URINE, RANDOM  Final   Special Requests NONE  Final   Culture   Final    NO GROWTH Performed at [facility], [address] [place], [place] [address]    Report Status 10/06/2016 FINAL  Final  Urine Culture     Status: None   Collection Time: 10/05/16  9:49 AM  Result Value Ref Range Status   Specimen Description CYSTOSCOPY  Final   Special Requests NONE  Final   Culture   Final    NO GROWTH Performed at [facility], [address] [place], [place] [address]    Report Status 10/06/2016 FINAL  Final  MRSA PCR Screening     Status: None   Collection Time: 10/05/16  1:58 PM  Result Value Ref Range Status   MRSA by PCR NEGATIVE NEGATIVE Final    Comment:        The GeneXpert MRSA Assay (FDA approved for NASAL specimens only), is one component of a comprehensive MRSA colonization surveillance program. It is not intended to diagnose MRSA infection nor to guide or monitor treatment for MRSA infections. Performed at [facility], [address] [place], [place] [address]   C difficile quick scan w PCR reflex     Status: None   Collection Time: 10/05/16  4:28 PM  Result Value Ref Range Status   C Diff antigen NEGATIVE NEGATIVE Final   C Diff toxin NEGATIVE NEGATIVE Final   C Diff interpretation No C. difficile detected.  Final    Studies/Results: Dg Chest 1 View  Result Date: 10/08/2016 CLINICAL DATA:  Cough and fever EXAM: CHEST 1 VIEW COMPARISON:  October 04, 2016 FINDINGS: The heart size and mediastinal contours are stable. The heart size is enlarged. Aorta is tortuous. Chronic elevation of right hemidiaphragm is unchanged. The lungs are clear. The visualized skeletal structures  are unremarkable. IMPRESSION: No active cardiopulmonary disease. Electronically Signed   By: Abelardo Diesel M.D.   On: 10/08/2016 21:01    Assessment/Plan:   Pt doing well. Had low grade temp yest but CXR nml, cultures have been NG. Appropriate for d/c.  Will followup in office in 2 weeks for voiding trial as well as to discuss/schedule stone extractiion   LOS: 3 days   Franchot Gallo M 10/09/2016, 8:29 AM

## 2016-10-09 NOTE — Discharge Summary (Signed)
Patient ID: Frederick Allen MRN: [id number] DOB/AGE: [birth year] 83 y.o.   P{lease see other d/c summary for in depth info--this is an addenddum for meds  Admit date: 10/04/2016 Discharge date: 10/09/2016  Primary Care Physician:  Derrill Center., MD  Discharge Diagnoses:  Left ureteral stone Present on Admission: . Calculus of ureter   Consults:  None   Discharge Medications: Allergies as of 10/09/2016      Reactions   Levothyroxine Anxiety, Palpitations, Shortness Of Breath   Dizziness   Celecoxib Other (See Comments)   Hypertension   Valdecoxib Other (See Comments)   Hypertension   Blue Dyes (parenteral) Other (See Comments)   unknown      Medication List    STOP taking these medications   doxycycline 100 MG capsule Commonly known as:  VIBRAMYCIN   oxyCODONE-acetaminophen 5-325 MG tablet Commonly known as:  PERCOCET/ROXICET     TAKE these medications   ferrous sulfate 325 (65 FE) MG tablet Take 325 mg by mouth 3 (three) times daily with meals.   ibuprofen 600 MG tablet Commonly known as:  ADVIL,MOTRIN Take 1 tablet (600 mg total) by mouth every 8 (eight) hours as needed.   loperamide 2 MG tablet Commonly known as:  IMODIUM A-D Take 2 mg by mouth 4 (four) times daily as needed for diarrhea or loose stools.   saccharomyces boulardii 250 MG capsule Commonly known as:  FLORASTOR Take 1 capsule (250 mg total) by mouth 2 (two) times daily.   traMADol 50 MG tablet Commonly known as:  ULTRAM Take 50 mg by mouth every 8 (eight) hours as needed for moderate pain.            Discharge Care Instructions        Start     Ordered   10/09/16 0000  saccharomyces boulardii (FLORASTOR) 250 MG capsule  2 times daily     10/09/16 0826       Significant Diagnostic Studies:  Ct Abdomen Pelvis Wo Contrast  Result Date: 10/05/2016 CLINICAL DATA:  Weak, lethargy, fever, abdominal pain. History of prostate cancer. EXAM: CT ABDOMEN AND PELVIS WITHOUT CONTRAST  TECHNIQUE: Multidetector CT imaging of the abdomen and pelvis was performed following the standard protocol without IV contrast. COMPARISON:  CT abdomen and pelvis report dated March 13, 2002 though images are not available for direct comparison. FINDINGS: LOWER CHEST: Lung bases are clear. Heart size is mildly enlarged. Moderate coronary artery calcifications. No pericardial effusion. Hepatic diaphragm attic eventration. HEPATOBILIARY: Status post cholecystectomy. Normal noncontrast CT liver. PANCREAS: Normal. SPLEEN: Normal. ADRENALS/URINARY TRACT: Kidneys are orthotopic, mildly atrophic LEFT kidney. Multiple lobulated LEFT renal cysts measuring to 4.8 cm. Duplicated LEFT renal collecting system to the ureterovesicular junction where a 7 mm calculus is present. Moderate LEFT hydronephrosis. Punctate LEFT upper pole nephrolithiasis. Limited assessment for renal masses on this nonenhanced examination. Urinary bladder is decompressed and unremarkable. Normal adrenal glands. STOMACH/BOWEL: The stomach, small and large bowel are normal in course and caliber without inflammatory changes, sensitivity decreased by lack of enteric contrast. Air-fluid levels in the small and large bowel. Mild colonic diverticulosis. Status post ileo cecectomy. VASCULAR/LYMPHATIC: Aortoiliac vessels are normal in caliber. Mild calcific atherosclerosis. No lymphadenopathy by CT size criteria. REPRODUCTIVE: Prostatectomy. OTHER: No intraperitoneal free fluid or free air. MUSCULOSKELETAL: Non-acute. Severe degenerative change of the spine. Anterior abdominal wall scarring. IMPRESSION: 1. 7 mm LEFT ureterovesicular junction calculus with moderate obstructive uropathy. Duplicated LEFT renal collecting system. 2. Small and large bowel air-fluid levels  seen with enteritis. No bowel obstruction. Postsurgical changes RIGHT colon. Aortic Atherosclerosis (ICD10-I70.0). Electronically Signed   By: Elon Alas M.D.   On: 10/05/2016 03:11   Dg  Chest 2 View  Result Date: 10/05/2016 CLINICAL DATA:  Sepsis fever EXAM: CHEST  2 VIEW COMPARISON:  06/11/2016, 04/01/2014 FINDINGS: Stable elevation of right diaphragm. Streaky atelectasis at the left base. Stable cardiomediastinal silhouette. No pneumothorax. No significant pleural effusion or new consolidation. IMPRESSION: No active cardiopulmonary disease. Electronically Signed   By: Donavan Foil M.D.   On: 10/05/2016 00:04   Dg C-arm 1-60 Min-no Report  Result Date: 10/05/2016 Fluoroscopy was utilized by the requesting physician.  No radiographic interpretation.    Brief H and P: For complete details please refer to admission H and P, but in brief see initial consult/H&P note  Hospital Course:  Active Problems:   Calculus of ureter   Pressure injury of skin   Day of Discharge BP (!) 119/59 (BP Location: Left Arm)   Pulse 60   Temp 99.1 F (37.3 C) (Oral)   Resp 20   Ht 5\' 8"  (1.727 m)   Wt 72.6 kg (160 lb)   SpO2 99%   BMI 24.33 kg/m   No results found for this or any previous visit (from the past 24 hour(s)).      Signed: Jorja Loa 10/09/2016, 12:33 PM

## 2016-10-09 NOTE — Progress Notes (Signed)
Report called to Lenna Sciara, Therapist, sports, at [facility]. [facility] transportation arranged by SW. Pt. Ready for d/c. Will continue to monitor until transport arrives.

## 2016-10-09 NOTE — Care Management Important Message (Signed)
Important Message  Patient Details  Name: Frederick Allen MRN: [id number] Date of Birth: Feb 06, 1917   Medicare Important Message Given:  Yes    Kerin Salen 10/09/2016, 10:43 AMImportant Message  Patient Details  Name: Frederick Allen MRN: [id number] Date of Birth: [birth year]   Medicare Important Message Given:  Yes    Kerin Salen 10/09/2016, 10:43 AM

## 2016-10-10 LAB — CULTURE, BLOOD (ROUTINE X 2)
Culture: NO GROWTH
Culture: NO GROWTH
Special Requests: ADEQUATE
Special Requests: ADEQUATE

## 2016-10-10 LAB — URINE CULTURE: Culture: NO GROWTH

## 2017-01-27 DEATH — deceased

## 2019-03-26 IMAGING — DX DG CHEST 1V
1 series · 1 of 1 positions shown · non-contrast
Comparison: October 04, 2016

CLINICAL DATA: Cough and fever

EXAM:
CHEST 1 VIEW

[chest ap]
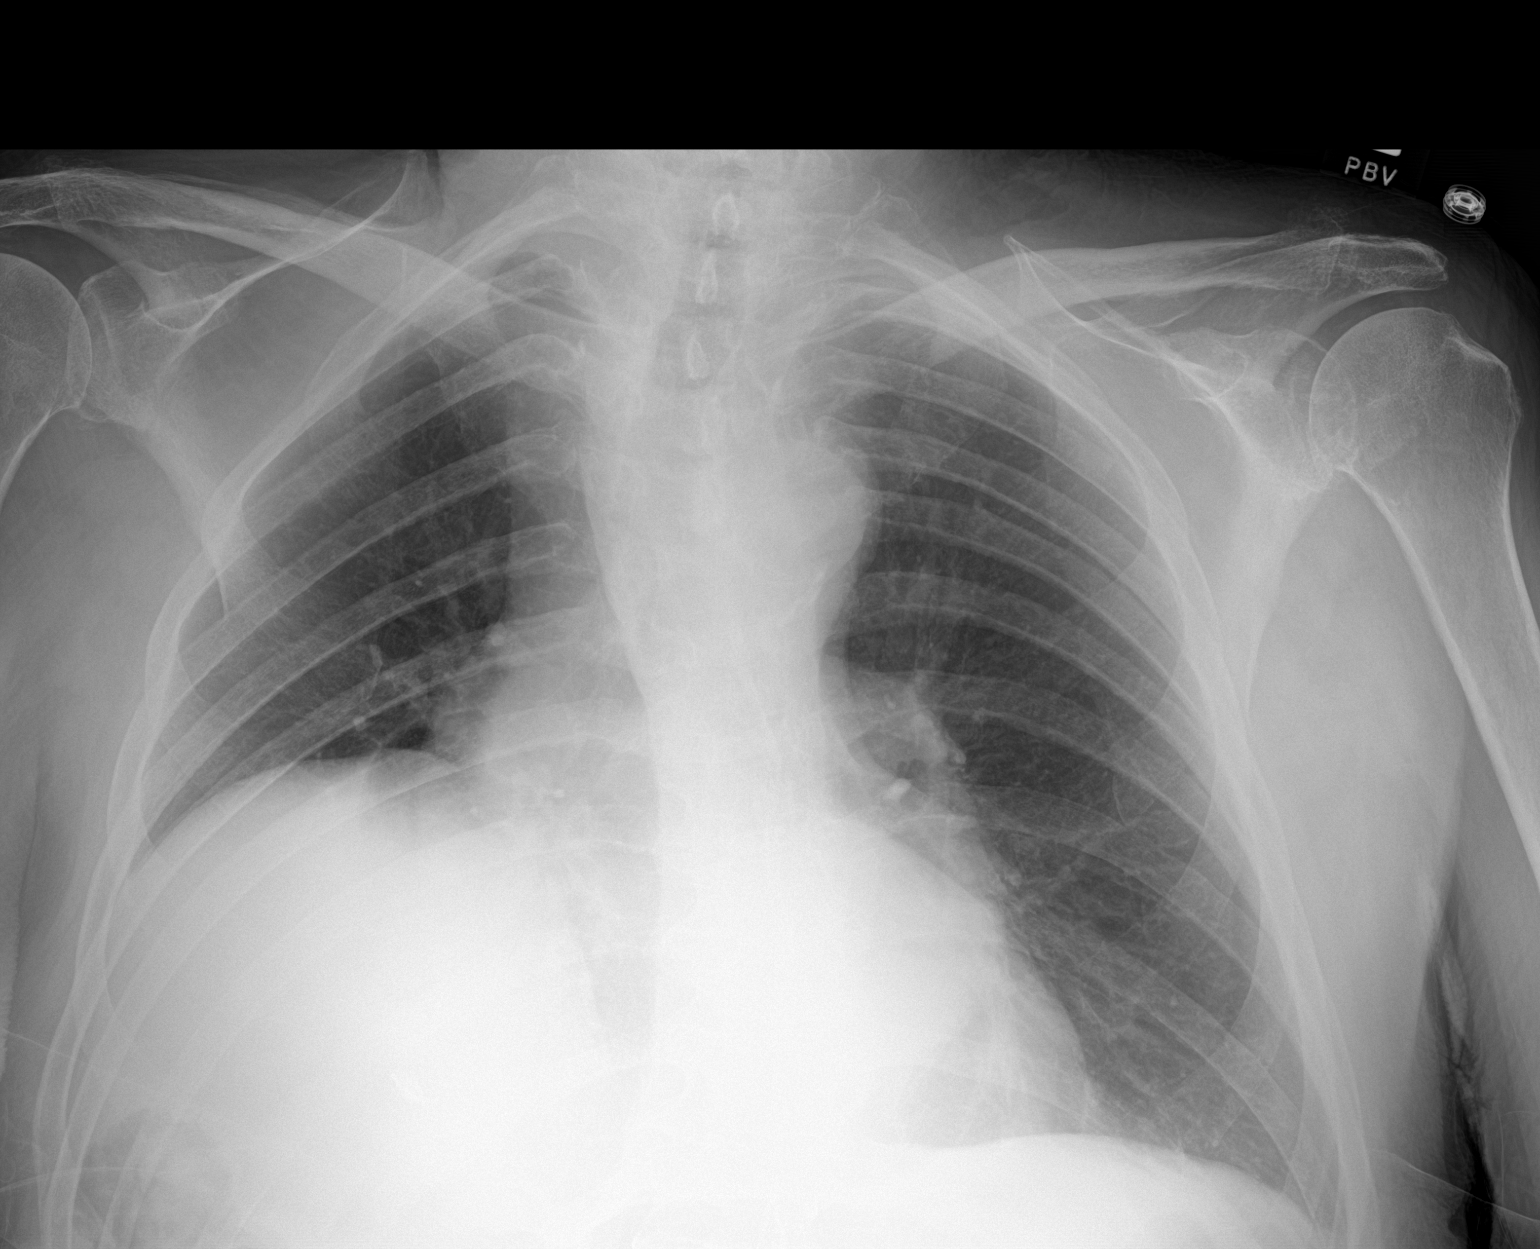

[1 of 1 positions shown; findings below may reference images not displayed]

FINDINGS: The heart size and mediastinal contours are stable. The heart size
is enlarged. Aorta is tortuous. Chronic elevation of right
hemidiaphragm is unchanged. The lungs are clear. The visualized
skeletal structures are unremarkable.
IMPRESSION: No active cardiopulmonary disease.
